# Patient Record
Sex: Female | Born: 1935 | Race: Black or African American | Hispanic: No | Smoking: Never smoker
Health system: Southern US, Community
[De-identification: ages and names within clinical notes are randomized; demographics above are authoritative.]

## PROBLEM LIST (undated history)

## (undated) DIAGNOSIS — F32A Depression, unspecified: Secondary | ICD-10-CM

## (undated) DIAGNOSIS — N289 Disorder of kidney and ureter, unspecified: Secondary | ICD-10-CM

## (undated) DIAGNOSIS — I4891 Unspecified atrial fibrillation: Secondary | ICD-10-CM

## (undated) DIAGNOSIS — D649 Anemia, unspecified: Secondary | ICD-10-CM

## (undated) DIAGNOSIS — F329 Major depressive disorder, single episode, unspecified: Secondary | ICD-10-CM

## (undated) DIAGNOSIS — F0390 Unspecified dementia without behavioral disturbance: Secondary | ICD-10-CM

## (undated) DIAGNOSIS — I10 Essential (primary) hypertension: Secondary | ICD-10-CM

---

## 2001-07-07 ENCOUNTER — Ambulatory Visit (HOSPITAL_COMMUNITY): Admission: RE | Admit: 2001-07-07 | Discharge: 2001-07-07 | Payer: Self-pay | Admitting: Family Medicine

## 2009-08-15 ENCOUNTER — Emergency Department (HOSPITAL_COMMUNITY): Admission: EM | Admit: 2009-08-15 | Discharge: 2009-08-15 | Payer: Self-pay | Admitting: Emergency Medicine

## 2011-02-14 ENCOUNTER — Ambulatory Visit (INDEPENDENT_AMBULATORY_CARE_PROVIDER_SITE_OTHER): Payer: Medicare Other

## 2011-02-14 DIAGNOSIS — L03119 Cellulitis of unspecified part of limb: Secondary | ICD-10-CM

## 2011-02-14 DIAGNOSIS — L02419 Cutaneous abscess of limb, unspecified: Secondary | ICD-10-CM

## 2011-02-14 DIAGNOSIS — M79609 Pain in unspecified limb: Secondary | ICD-10-CM

## 2011-02-14 DIAGNOSIS — Z23 Encounter for immunization: Secondary | ICD-10-CM

## 2011-04-11 ENCOUNTER — Ambulatory Visit: Payer: Medicare Other

## 2011-04-11 ENCOUNTER — Ambulatory Visit (INDEPENDENT_AMBULATORY_CARE_PROVIDER_SITE_OTHER): Payer: Medicare Other | Admitting: Family Medicine

## 2011-04-11 VITALS — BP 157/66 | HR 73 | Temp 98.1°F | Resp 16 | Ht 59.5 in | Wt 130.0 lb

## 2011-04-11 DIAGNOSIS — R3589 Other polyuria: Secondary | ICD-10-CM

## 2011-04-11 DIAGNOSIS — I499 Cardiac arrhythmia, unspecified: Secondary | ICD-10-CM

## 2011-04-11 DIAGNOSIS — R358 Other polyuria: Secondary | ICD-10-CM

## 2011-04-11 DIAGNOSIS — I4891 Unspecified atrial fibrillation: Secondary | ICD-10-CM | POA: Insufficient documentation

## 2011-04-11 DIAGNOSIS — R609 Edema, unspecified: Secondary | ICD-10-CM

## 2011-04-11 DIAGNOSIS — D649 Anemia, unspecified: Secondary | ICD-10-CM | POA: Insufficient documentation

## 2011-04-11 LAB — POCT URINALYSIS DIPSTICK
Bilirubin, UA: NEGATIVE
Blood, UA: NEGATIVE
Glucose, UA: NEGATIVE
Ketones, UA: NEGATIVE
Nitrite, UA: POSITIVE
Protein, UA: NEGATIVE
Spec Grav, UA: 1.01
Urobilinogen, UA: 0.2
pH, UA: 6.5

## 2011-04-11 LAB — POCT CBC
Granulocyte percent: 49.8 %G (ref 37–80)
HCT, POC: 30.9 % — AB (ref 37.7–47.9)
Hemoglobin: 9.8 g/dL — AB (ref 12.2–16.2)
Lymph, poc: 2.3 (ref 0.6–3.4)
MCH, POC: 30.4 pg (ref 27–31.2)
MCHC: 31.7 g/dL — AB (ref 31.8–35.4)
MCV: 35.9 fL — AB (ref 80–97)
MID (cbc): 0.3 (ref 0–0.9)
MPV: 8 fL (ref 0–99.8)
POC Granulocyte: 2.6 (ref 2–6.9)
POC LYMPH PERCENT: 45.1 %L (ref 10–50)
POC MID %: 5.1 %M (ref 0–12)
Platelet Count, POC: 224 10*3/uL (ref 142–424)
RBC: 3.22 M/uL — AB (ref 4.04–5.48)
RDW, POC: 13.1 %
WBC: 5.2 10*3/uL (ref 4.6–10.2)

## 2011-04-11 LAB — POCT UA - MICROSCOPIC ONLY
Casts, Ur, LPF, POC: NEGATIVE
Crystals, Ur, HPF, POC: NEGATIVE
Epithelial cells, urine per micros: NEGATIVE
Mucus, UA: NEGATIVE
RBC, urine, microscopic: NEGATIVE
Yeast, UA: NEGATIVE

## 2011-04-11 LAB — GLUCOSE, POCT (MANUAL RESULT ENTRY): POC Glucose: 83

## 2011-04-11 MED ORDER — CYANOCOBALAMIN 1000 MCG/ML IJ SOLN
1000.0000 ug | Freq: Once | INTRAMUSCULAR | Status: AC
  Administered 2011-04-11: 1000 ug via INTRAMUSCULAR

## 2011-04-11 MED ORDER — FUROSEMIDE 20 MG PO TABS: 20.0000 mg | ORAL_TABLET | Freq: Every day | ORAL | Status: DC

## 2011-04-11 MED ORDER — NITROFURANTOIN MONOHYD MACRO 100 MG PO CAPS: 100.0000 mg | ORAL_CAPSULE | Freq: Two times a day (BID) | ORAL | Status: AC

## 2011-04-11 NOTE — Progress Notes (Signed)
78 year old woman with memory problems who presents with a 3 to four-day history of progressive edema in the lower extremities. Denies chest pain shortness of breath, but notes some polyuria. She is brought in by her family members.  Objective: Elderly African American woman in no acute distress.  HEENT: Unremarkable without JVD, dyspnea,  Chest: Clear to auscultation  Heart: Holosystolic best heard at the left heart border 3/6, no gallop  Abdomen: Soft nontender, mildly obese  Extremities: 3+ edema with mild erythema halfway to the knees and diffuse tenderness over the anterior shins  EKG: Atrial fibrillation  [facility] reading (PRIMARY) by  Dr. Milus Glazier NAD. Results for orders placed in visit on 04/11/11  POCT CBC      Component Value Range   WBC 5.2  4.6 - 10.2 (K/uL)   Lymph, poc 2.3  0.6 - 3.4    POC LYMPH PERCENT 45.1  10 - 50 (%L)   MID (cbc) 0.3  0 - 0.9    POC MID % 5.1  0 - 12 (%M)   POC Granulocyte 2.6  2 - 6.9    Granulocyte percent 49.8  37 - 80 (%G)   RBC 3.22 (*) 4.04 - 5.48 (M/uL)   Hemoglobin 9.8 (*) 12.2 - 16.2 (g/dL)   HCT, POC 54.0 (*) 98.1 - 47.9 (%)   MCV 35.9 (*) 80 - 97 (fL)   MCH, POC 30.4  27 - 31.2 (pg)   MCHC 31.7 (*) 31.8 - 35.4 (g/dL)   RDW, POC 19.1     Platelet Count, POC 224  142 - 424 (K/uL)   MPV 8.0  0 - 99.8 (fL)  POCT URINALYSIS DIPSTICK      Component Value Range   Color, UA yellow     Clarity, UA clear     Glucose, UA negative     Bilirubin, UA negative     Ketones, UA negative     Spec Grav, UA 1.010     Blood, UA negative     pH, UA 6.5     Protein, UA negative     Urobilinogen, UA 0.2     Nitrite, UA positive     Leukocytes, UA small (1+)    GLUCOSE, POCT (MANUAL RESULT ENTRY)      Component Value Range   POC Glucose 83     I spoke with Dr. Jacinto Halim about the new a.fib.Marland Kitchenshe will be called by his office on Monday  A:  Anemia, UTI, new onset a.fib  P:  b12 shot, follow up 1 month for anemia Macrobid 100 bid x 7 days Dr.  Jacinto Halim review on Monday

## 2011-04-12 LAB — COMPREHENSIVE METABOLIC PANEL
ALT: 11 U/L (ref 0–35)
AST: 24 U/L (ref 0–37)
Albumin: 4.1 g/dL (ref 3.5–5.2)
Alkaline Phosphatase: 65 U/L (ref 39–117)
BUN: 19 mg/dL (ref 6–23)
CO2: 25 mEq/L (ref 19–32)
Calcium: 9.7 mg/dL (ref 8.4–10.5)
Chloride: 108 mEq/L (ref 96–112)
Creat: 1.18 mg/dL — ABNORMAL HIGH (ref 0.50–1.10)
Glucose, Bld: 77 mg/dL (ref 70–99)
Potassium: 4.9 mEq/L (ref 3.5–5.3)
Sodium: 139 mEq/L (ref 135–145)
Total Bilirubin: 0.5 mg/dL (ref 0.3–1.2)
Total Protein: 7.4 g/dL (ref 6.0–8.3)

## 2011-04-13 LAB — URINE CULTURE: Colony Count: >=100000

## 2011-06-11 ENCOUNTER — Telehealth: Payer: Self-pay | Admitting: Family Medicine

## 2011-06-11 NOTE — Telephone Encounter (Signed)
Message copied by Elvina Sidle on Wed Jun 11, 2011 12:47 PM ------      Message from: Thelma Barge D      Created: Tue Jun 10, 2011  8:59 PM       CHART IS IN YOUR BOX FOR REVIEW.  BRAD MCADAMS FROM [facility] CAME IN AND NEEDED A FORM FOR REFERRAL TO THEM.  HE STATED THAT PT IS UNABLE TO CARE FOR SELF.

## 2011-06-11 NOTE — Telephone Encounter (Signed)
I have not seen this patient in 4 months.  It looks like someone wants me to sign a hospice form.  I do not have information to substantiate a referral to hospice.

## 2011-06-12 NOTE — Telephone Encounter (Signed)
Form was signed by Dr L and faxed back to [facility]. LMOM for DSS caseworker, Salome Spotted, that was done

## 2011-06-18 ENCOUNTER — Other Ambulatory Visit: Payer: Self-pay | Admitting: Family Medicine

## 2011-06-24 ENCOUNTER — Encounter: Payer: Self-pay | Admitting: Internal Medicine

## 2011-06-24 ENCOUNTER — Ambulatory Visit (INDEPENDENT_AMBULATORY_CARE_PROVIDER_SITE_OTHER): Payer: Medicare Other | Admitting: Internal Medicine

## 2011-06-24 VITALS — BP 159/62 | HR 62 | Temp 98.0°F | Resp 16 | Ht <= 58 in | Wt 125.0 lb

## 2011-06-24 DIAGNOSIS — N289 Disorder of kidney and ureter, unspecified: Secondary | ICD-10-CM

## 2011-06-24 DIAGNOSIS — E785 Hyperlipidemia, unspecified: Secondary | ICD-10-CM

## 2011-06-24 DIAGNOSIS — R011 Cardiac murmur, unspecified: Secondary | ICD-10-CM

## 2011-06-24 DIAGNOSIS — Z9114 Patient's other noncompliance with medication regimen: Secondary | ICD-10-CM

## 2011-06-24 DIAGNOSIS — I350 Nonrheumatic aortic (valve) stenosis: Secondary | ICD-10-CM | POA: Insufficient documentation

## 2011-06-24 DIAGNOSIS — R609 Edema, unspecified: Secondary | ICD-10-CM

## 2011-06-24 DIAGNOSIS — N189 Chronic kidney disease, unspecified: Secondary | ICD-10-CM

## 2011-06-24 LAB — POCT CBC
Granulocyte percent: 50.9 %G (ref 37–80)
Hemoglobin: 10.4 g/dL — AB (ref 12.2–16.2)
MCH, POC: 30.7 pg (ref 27–31.2)
MCV: 95.4 fL (ref 80–97)
MPV: 8.2 fL (ref 0–99.8)
POC MID %: 6.6 %M (ref 0–12)
RBC: 3.39 M/uL — AB (ref 4.04–5.48)
WBC: 7 10*3/uL (ref 4.6–10.2)

## 2011-06-24 LAB — POCT URINALYSIS DIPSTICK
Glucose, UA: NEGATIVE
Ketones, UA: NEGATIVE
Spec Grav, UA: 1.015

## 2011-06-24 LAB — COMPREHENSIVE METABOLIC PANEL
ALT: 11 U/L (ref 0–35)
AST: 23 U/L (ref 0–37)
CO2: 27 mEq/L (ref 19–32)
Calcium: 10.2 mg/dL (ref 8.4–10.5)
Chloride: 107 mEq/L (ref 96–112)
Sodium: 141 mEq/L (ref 135–145)
Total Protein: 7.7 g/dL (ref 6.0–8.3)

## 2011-06-24 LAB — POCT UA - MICROSCOPIC ONLY

## 2011-06-24 NOTE — Progress Notes (Signed)
Subjective:    Patient ID: Crystal Rowland, female    DOB: 07-03-1914, 78 y.o.   MRN: [id number]  HPI C/o bilateral leg and foot swelling right leg swelling is worse than the left onset 3 days ago, increased in the am, Moderate in severity, no leg pain, no radiation of swelling, No chest pain, no shortness of breath no fatigue Patient it not using a pill dispenser for the week, often forgets to take meds as per niece and ran out of lasix for one week restarted it todaypt is also requesting a vit b shot, says she has gotten them in the past  Review of Systems  Constitutional: Negative.   HENT: Negative.   Eyes: Negative.   Respiratory: Negative.   Cardiovascular: Negative.   Gastrointestinal: Negative.   Genitourinary: Negative.   Musculoskeletal: Negative.        Extremitiy swelling  Skin: Negative.   Neurological: Negative.   Hematological: Negative.   Psychiatric/Behavioral: Negative.   All other systems reviewed and are negative.       Objective:   Physical Exam  Nursing note and vitals reviewed. Constitutional: She is oriented to person, place, and time. She appears well-developed and well-nourished.  HENT:  Head: Normocephalic and atraumatic.  Right Ear: External ear normal.  Left Ear: External ear normal.  Eyes: Conjunctivae and EOM are normal. Pupils are equal, round, and reactive to light.  Neck: Normal range of motion. Neck supple.  Cardiovascular: Normal rate and regular rhythm.   Murmur heard.      3/6 systolic ejection murmur;rate and rhythm are regular  Pulmonary/Chest: Effort normal and breath sounds normal.       Lungs are clear no rales  Abdominal: Soft. Bowel sounds are normal.  Musculoskeletal: She exhibits edema.       Right leg edema is greater than the left  Neurological: She is alert and oriented to person, place, and time. She has normal reflexes.  Skin: Skin is warm and dry.  Psychiatric: She has a normal mood and affect. Her behavior is  normal. Judgment and thought content normal.      Results for orders placed in visit on 06/24/11  POCT CBC      Component Value Range   WBC 7.0  4.6 - 10.2 (K/uL)   Lymph, poc 3.0  0.6 - 3.4    POC LYMPH PERCENT 42.5  10 - 50 (%L)   MID (cbc) 0.5  0 - 0.9    POC MID % 6.6  0 - 12 (%M)   POC Granulocyte 3.6  2 - 6.9    Granulocyte percent 50.9  37 - 80 (%G)   RBC 3.39 (*) 4.04 - 5.48 (M/uL)   Hemoglobin 10.4 (*) 12.2 - 16.2 (g/dL)   HCT, POC 40.9 (*) 81.1 - 47.9 (%)   MCV 95.4  80 - 97 (fL)   MCH, POC 30.7  27 - 31.2 (pg)   MCHC 32.2  31.8 - 35.4 (g/dL)   RDW, POC 91.4     Platelet Count, POC 250  142 - 424 (K/uL)   MPV 8.2  0 - 99.8 (fL)  POCT UA - MICROSCOPIC ONLY      Component Value Range   WBC, Ur, HPF, POC TNTC     RBC, urine, microscopic 0-1     Bacteria, U Microscopic 4+     Mucus, UA negative     Epithelial cells, urine per micros 0-2     Crystals, Ur, HPF, POC  negative     Casts, Ur, LPF, POC negative     Yeast, UA negative    POCT URINALYSIS DIPSTICK      Component Value Range   Color, UA yellow     Clarity, UA cloudy     Glucose, UA negative     Bilirubin, UA negative     Ketones, UA negative     Spec Grav, UA 1.015     Blood, UA negative     pH, UA 6.0     Protein, UA negative     Urobilinogen, UA 1.0     Nitrite, UA negative     Leukocytes, UA small (1+)       anemia  uti Assessment & Plan:  78 year old woman noncompliant with meds, niece brought her today nad has refilled rx for her for lasix Hx of anemia, renal insuffiencey, hyperlipidemia, chf, htn. Will eval ekg labs encourage compliance with meds and follow up primary care with [facility] Anemia stable h/h with normal indices dlikely chronic disease.  uti will rx with abs  uti

## 2011-06-24 NOTE — Patient Instructions (Signed)
Use a weekly pill dispense. Take meds as directed. Bactrim ds 1 tab 2 times daily f or 3 days. F/up with [facility] and your cardiologist.

## 2011-07-10 ENCOUNTER — Telehealth: Payer: Self-pay

## 2011-07-10 NOTE — Telephone Encounter (Signed)
Faxed over records per request.

## 2011-07-10 NOTE — Telephone Encounter (Signed)
Brad Mcadams from Hospice is requesting pt recent labs, he states he hs  auth and he would send his nurse over with info, per [employer] over.  fax to [phone]

## 2011-08-18 ENCOUNTER — Other Ambulatory Visit: Payer: Self-pay | Admitting: Family Medicine

## 2011-08-22 ENCOUNTER — Telehealth: Payer: Self-pay

## 2011-11-17 ENCOUNTER — Encounter: Payer: Self-pay | Admitting: *Deleted

## 2011-11-17 DIAGNOSIS — I350 Nonrheumatic aortic (valve) stenosis: Secondary | ICD-10-CM

## 2012-10-27 ENCOUNTER — Emergency Department (HOSPITAL_COMMUNITY)
Admission: EM | Admit: 2012-10-27 | Discharge: 2012-10-27 | Disposition: A | Discharge status: Home / Self Care | Payer: Medicare Other | Attending: Emergency Medicine | Admitting: Emergency Medicine

## 2012-10-27 DIAGNOSIS — IMO0001 Reserved for inherently not codable concepts without codable children: Secondary | ICD-10-CM | POA: Insufficient documentation

## 2012-10-27 DIAGNOSIS — R252 Cramp and spasm: Secondary | ICD-10-CM

## 2012-10-27 DIAGNOSIS — M79609 Pain in unspecified limb: Secondary | ICD-10-CM | POA: Insufficient documentation

## 2012-10-27 LAB — URINE MICROSCOPIC-ADD ON

## 2012-10-27 LAB — POCT I-STAT, CHEM 8
BUN: 18 mg/dL (ref 6–23)
Creatinine, Ser: 1.4 mg/dL — ABNORMAL HIGH (ref 0.50–1.10)
Hemoglobin: 10.5 g/dL — ABNORMAL LOW (ref 12.0–15.0)
Potassium: 4.2 mEq/L (ref 3.5–5.1)
Sodium: 142 mEq/L (ref 135–145)
TCO2: 23 mmol/L (ref 0–100)

## 2012-10-27 LAB — URINALYSIS, ROUTINE W REFLEX MICROSCOPIC
Glucose, UA: NEGATIVE mg/dL
Hgb urine dipstick: NEGATIVE
Protein, ur: NEGATIVE mg/dL
Specific Gravity, Urine: 1.015 (ref 1.005–1.030)
Urobilinogen, UA: 0.2 mg/dL (ref 0.0–1.0)

## 2012-10-27 MED ORDER — CEPHALEXIN 500 MG PO CAPS: 500.0000 mg | ORAL_CAPSULE | Freq: Three times a day (TID) | ORAL | Status: DC

## 2012-10-27 NOTE — Progress Notes (Signed)
Pt confirms pcp is Dr Adrian Saran bland and Pt states Dr Delrae Rend this is the last dr she saw on August 06 2010 EPIC updated

## 2012-10-27 NOTE — ED Provider Notes (Signed)
Medical screening examination/treatment/procedure(s) were conducted as a shared visit with non-physician practitioner(s) and myself.  I personally evaluated the patient during the encounter  Presented with cramping of both legs. Exam revealed no skin changes, no swelling, no calf tenderness or cords. W/U negative.  Gilda Crease, MD 10/27/12 [phone]

## 2012-10-27 NOTE — ED Provider Notes (Signed)
CSN: [id number]     Arrival date & time 10/27/12  1343 History   First MD Initiated Contact with Patient 10/27/12 1505     Chief Complaint  Patient presents with  . Leg cramps    (Consider location/radiation/quality/duration/timing/severity/associated sxs/prior Treatment) The history is provided by the patient, medical records and a relative.   Pt presents to the ED for bilateral LE cramping, onset this morning upon waking.  Pt states cramping was short lived, lasting approx 15 minutes before resolving completely without recurrence.  No associated leg swelling or difficulty walking. No recent falls, injury, or traumas to the legs.  No hx of DVT.  Family also states that pt has been off of all her HTN meds for an unknown period of time, likely several months, and they would like them refilled.  No chest pain, SOB, abdominal pain, N/V/D.  No past medical history on file. No past surgical history on file. No family history on file. History  Substance Use Topics  . Smoking status: Never Smoker   . Smokeless tobacco: Not on file  . Alcohol Use: Not on file   OB History   Grav Para Term Preterm Abortions TAB SAB Ect Mult Living                 Review of Systems  Musculoskeletal: Positive for myalgias.  All other systems reviewed and are negative.    Allergies  Review of patient's allergies indicates no known allergies.  Home Medications  No current outpatient prescriptions on file. BP 169/54  Pulse 70  Temp(Src) 98.3 F (36.8 C) (Oral)  Resp 16  SpO2 98%  Physical Exam  Nursing note and vitals reviewed. Constitutional: She is oriented to person, place, and time. She appears well-developed and well-nourished. No distress.  HENT:  Head: Normocephalic and atraumatic.  Mouth/Throat: Oropharynx is clear and moist.  Eyes: Conjunctivae and EOM are normal.  Neck: Normal range of motion. Neck supple.  Cardiovascular: Normal rate, regular rhythm and normal heart sounds.    Pulmonary/Chest: Effort normal and breath sounds normal. No respiratory distress. She has no wheezes.  Musculoskeletal: Normal range of motion. She exhibits no edema.  No calf pain, asymmetry, or palpable cord; overlying skin normal in appearance; strong distal pulse and cap refill, sensation intact  Neurological: She is alert and oriented to person, place, and time.  Skin: Skin is warm and dry. She is not diaphoretic.  Psychiatric: She has a normal mood and affect.    ED Course  Procedures (including critical care time) Labs Review Labs Reviewed  URINALYSIS, ROUTINE W REFLEX MICROSCOPIC - Abnormal; Notable for the following:    APPearance CLOUDY (*)    Nitrite POSITIVE (*)    Leukocytes, UA MODERATE (*)    All other components within normal limits  URINE MICROSCOPIC-ADD ON - Abnormal; Notable for the following:    Bacteria, UA MANY (*)    All other components within normal limits  POCT I-STAT, CHEM 8 - Abnormal; Notable for the following:    Creatinine, Ser 1.40 (*)    Hemoglobin 10.5 (*)    HCT 31.0 (*)    All other components within normal limits  URINE CULTURE   Imaging Review No results found.  MDM   1. Leg cramps    Electrolytes WNL, SrCr appears at baseline when compared with previous.  I doubt DVT at this time.  At time of d/c family mentions that pt has been urinating frequently-- u/a nitrite +, culture pending.  Rx keflex.  Pt will FU with her PCP for re-check and medication management.  Discussed plan with pt and family, they agreed.  Return precautions advised.  Discussed pt with Dr. Blinda Leatherwood who personally evaluated pt and agrees with plan.  Garlon Hatchet, PA-C 10/27/12 2110

## 2012-10-27 NOTE — ED Notes (Signed)
Per PA, waiting for UA results before discharging pt.

## 2012-10-27 NOTE — ED Notes (Signed)
Pt c/o bilateral leg cramps at home and states "now I feel better I don't feel any cramping or nothin."

## 2012-10-27 NOTE — ED Notes (Signed)
Pt family reports pt has been having frequent urination and requested UA be done.

## 2012-10-29 LAB — URINE CULTURE: Colony Count: >=100000

## 2012-10-30 ENCOUNTER — Telehealth (HOSPITAL_COMMUNITY): Payer: Self-pay | Admitting: Emergency Medicine

## 2012-10-30 NOTE — ED Notes (Signed)
Post ED Visit - Positive Culture Follow-up  Culture report reviewed by antimicrobial stewardship pharmacist: []  Wes Dulaney, Pharm.D., BCPS []  Celedonio Miyamoto, Pharm.D., BCPS []  Georgina Pillion, Pharm.D., BCPS []  [place], [address].D., BCPS, AAHIVP []  Estella Husk, Pharm.D., BCPS, AAHIVP [x]  Lora Seay, Pharm.D., BCPS  Positive urine culture Treated with Keflex, organism sensitive to the same and no further patient follow-up is required at this time.  Kylie A Holland 10/30/2012, 4:56 PM

## 2013-12-15 ENCOUNTER — Emergency Department (HOSPITAL_BASED_OUTPATIENT_CLINIC_OR_DEPARTMENT_OTHER)
Admission: EM | Admit: 2013-12-15 | Discharge: 2013-12-15 | Disposition: A | Discharge status: Home / Self Care | Payer: Medicare Other | Attending: Emergency Medicine | Admitting: Emergency Medicine

## 2013-12-15 ENCOUNTER — Emergency Department (HOSPITAL_BASED_OUTPATIENT_CLINIC_OR_DEPARTMENT_OTHER): Payer: Medicare Other

## 2013-12-15 ENCOUNTER — Encounter (HOSPITAL_BASED_OUTPATIENT_CLINIC_OR_DEPARTMENT_OTHER): Payer: Self-pay | Admitting: Emergency Medicine

## 2013-12-15 DIAGNOSIS — Z792 Long term (current) use of antibiotics: Secondary | ICD-10-CM | POA: Diagnosis not present

## 2013-12-15 DIAGNOSIS — Y939 Activity, unspecified: Secondary | ICD-10-CM | POA: Diagnosis not present

## 2013-12-15 DIAGNOSIS — W01198A Fall on same level from slipping, tripping and stumbling with subsequent striking against other object, initial encounter: Secondary | ICD-10-CM | POA: Diagnosis not present

## 2013-12-15 DIAGNOSIS — S0990XA Unspecified injury of head, initial encounter: Secondary | ICD-10-CM | POA: Insufficient documentation

## 2013-12-15 DIAGNOSIS — W19XXXA Unspecified fall, initial encounter: Secondary | ICD-10-CM

## 2013-12-15 DIAGNOSIS — Y92009 Unspecified place in unspecified non-institutional (private) residence as the place of occurrence of the external cause: Secondary | ICD-10-CM | POA: Insufficient documentation

## 2013-12-15 NOTE — Discharge Instructions (Signed)

## 2013-12-15 NOTE — ED Notes (Signed)
Pt "stumbled" at home and fell onto cement, hitting left side of foreahead.  No LOC. No blood thinners.  Pt lives in private home by herself.  Presents with neighbor who helps her.

## 2013-12-15 NOTE — ED Provider Notes (Signed)
CSN: [id number]     Arrival date & time 12/15/13  1726 History   First MD Initiated Contact with Patient 12/15/13 1742     Chief Complaint  Patient presents with  . Fall      HPI Patient had an accidental mechanical fall at home onto the cement.  Struck her head but no loss of consciousness.  Is on no blood thinners the present time.  Denies headache or vomiting.  Has some tenderness of the left hip.  Brought in by a neighbor.  Otherwise no complaints. History reviewed. No pertinent past medical history. History reviewed. No pertinent past surgical history. No family history on file. History  Substance Use Topics  . Smoking status: Never Smoker   . Smokeless tobacco: Not on file  . Alcohol Use: No   OB History   Grav Para Term Preterm Abortions TAB SAB Ect Mult Living                 Review of Systems  All other systems reviewed and are negative  Allergies  Review of patient's allergies indicates no known allergies.  Home Medications   Prior to Admission medications   Medication Sig Start Date End Date Taking? Authorizing Provider  cephALEXin (KEFLEX) 500 MG capsule Take 1 capsule (500 mg total) by mouth 3 (three) times daily. 10/27/12   Garlon HatchetLisa M Sanders, PA-C   BP 151/59  Pulse 76  Temp(Src) 98.6 F (37 C) (Oral)  Resp 16  Ht 4\' 11"  (1.499 m)  Wt 128 lb (58.06 kg)  BMI 25.84 kg/m2  SpO2 100% Physical Exam Physical Exam  Nursing note and vitals reviewed. Constitutional: She is oriented to person, place, and time. She appears well-developed and well-nourished. No distress.  HENT:  Head: Normocephalic contusion hematoma noted to left forehead.  Eyes: Pupils are equal, round, and reactive to light.  Neck: Normal range of motion.  Cardiovascular: Normal rate and intact distal pulses.   Pulmonary/Chest: No respiratory distress.  Abdominal: Normal appearance. She exhibits no distension.  Musculoskeletal: Normal range of motion.  some tenderness to the left hip to  palpation and movement.  Very mild. Neurological: She is alert and oriented to person, place, and time. No cranial nerve deficit.  Skin: Skin is warm and dry. No rash noted.  Psychiatric: She has a normal mood and affect. Her behavior is normal.   ED Course  Procedures (including critical care time) Labs Review Labs Reviewed - No data to display  Imaging Review Dg Hip Complete Left  12/15/2013   CLINICAL DATA:  Fall, left hip pain  EXAM: LEFT HIP - COMPLETE 2+ VIEW  COMPARISON:  None.  FINDINGS: No fracture or dislocation is seen.  Degenerative changes of the bilateral hips.  Visualized bony pelvis appears intact.  Degenerative changes of the lumbar spine.  Calcified uterine fibroid.  IMPRESSION: No fracture or dislocation is seen.   Electronically Signed   By: Charline BillsSriyesh  Krishnan M.D.   On: 12/15/2013 18:51   Ct Head Wo Contrast  12/15/2013   CLINICAL DATA:  Fall, hit left forehead  EXAM: CT HEAD WITHOUT CONTRAST  TECHNIQUE: Contiguous axial images were obtained from the base of the skull through the vertex without intravenous contrast.  COMPARISON:  None.  FINDINGS: No evidence of parenchymal hemorrhage or extra-axial fluid collection. No mass lesion, mass effect, or midline shift.  No CT evidence of acute infarction.  Subcortical white matter and periventricular small vessel ischemic changes. Intracranial atherosclerosis.  Age related atrophy.  No ventriculomegaly.  The visualized paranasal sinuses are essentially clear. The mastoid air cells are unopacified.  No evidence of calvarial fracture.  IMPRESSION: No evidence of acute intracranial abnormality.  Atrophy with small vessel ischemic changes and intracranial atherosclerosis.   Electronically Signed   By: Charline BillsSriyesh  Krishnan M.D.   On: 12/15/2013 18:54      MDM   Final diagnoses:  Fall, initial encounter        Nelia Shiobert L Neriyah Cercone, MD 12/15/13 Ernestina [id number]

## 2014-01-09 ENCOUNTER — Emergency Department (HOSPITAL_BASED_OUTPATIENT_CLINIC_OR_DEPARTMENT_OTHER)
Admission: EM | Admit: 2014-01-09 | Discharge: 2014-01-09 | Disposition: A | Discharge status: Home / Self Care | Payer: Medicare Other | Attending: Emergency Medicine | Admitting: Emergency Medicine

## 2014-01-09 ENCOUNTER — Emergency Department (HOSPITAL_BASED_OUTPATIENT_CLINIC_OR_DEPARTMENT_OTHER): Payer: Medicare Other

## 2014-01-09 ENCOUNTER — Encounter (HOSPITAL_BASED_OUTPATIENT_CLINIC_OR_DEPARTMENT_OTHER): Payer: Self-pay | Admitting: *Deleted

## 2014-01-09 DIAGNOSIS — Y998 Other external cause status: Secondary | ICD-10-CM | POA: Diagnosis not present

## 2014-01-09 DIAGNOSIS — Z79899 Other long term (current) drug therapy: Secondary | ICD-10-CM | POA: Insufficient documentation

## 2014-01-09 DIAGNOSIS — S0990XA Unspecified injury of head, initial encounter: Secondary | ICD-10-CM | POA: Diagnosis not present

## 2014-01-09 DIAGNOSIS — I10 Essential (primary) hypertension: Secondary | ICD-10-CM | POA: Insufficient documentation

## 2014-01-09 DIAGNOSIS — S79912A Unspecified injury of left hip, initial encounter: Secondary | ICD-10-CM | POA: Insufficient documentation

## 2014-01-09 DIAGNOSIS — R52 Pain, unspecified: Secondary | ICD-10-CM

## 2014-01-09 DIAGNOSIS — Y92009 Unspecified place in unspecified non-institutional (private) residence as the place of occurrence of the external cause: Secondary | ICD-10-CM | POA: Insufficient documentation

## 2014-01-09 DIAGNOSIS — Z792 Long term (current) use of antibiotics: Secondary | ICD-10-CM | POA: Insufficient documentation

## 2014-01-09 DIAGNOSIS — Y9389 Activity, other specified: Secondary | ICD-10-CM | POA: Insufficient documentation

## 2014-01-09 DIAGNOSIS — S199XXA Unspecified injury of neck, initial encounter: Secondary | ICD-10-CM | POA: Diagnosis present

## 2014-01-09 DIAGNOSIS — W1830XA Fall on same level, unspecified, initial encounter: Secondary | ICD-10-CM | POA: Diagnosis not present

## 2014-01-09 DIAGNOSIS — S161XXA Strain of muscle, fascia and tendon at neck level, initial encounter: Secondary | ICD-10-CM | POA: Diagnosis not present

## 2014-01-09 HISTORY — DX: Essential (primary) hypertension: I10

## 2014-01-09 MED ORDER — ACETAMINOPHEN 325 MG PO TABS
650.0000 mg | ORAL_TABLET | Freq: Once | ORAL | Status: AC
  Administered 2014-01-09: 650 mg via ORAL
  Filled 2014-01-09: qty 2

## 2014-01-09 NOTE — ED Provider Notes (Signed)
CSN: [id number]     Arrival date & time 01/09/14  1043 History   First MD Initiated Contact with Patient 01/09/14 1101     Chief Complaint  Patient presents with  . Neck Pain     (Consider location/radiation/quality/duration/timing/severity/associated sxs/prior Treatment) HPI Patient stumbled and fell at her home on 12/15/2013. She complains of neck pain left-sided paracervical since the event pain is nonradiating. She struck her head and complained of left hip pain as result fall. She reports that headache and hip pain have improved since the event. Neck pain improved temporarily but is worse over the past few days. Pain is worse with rotating her neck from side to side improved with remaining still. Pain is nonradiating. No treatment prior to coming here she had CT scan of her head and x-ray of left hip performed at 12/15/2013 which were both negative for acute injury. Past Medical History  Diagnosis Date  . Hypertension    History reviewed. No pertinent past surgical history. No family history on file. History  Substance Use Topics  . Smoking status: Never Smoker   . Smokeless tobacco: Not on file  . Alcohol Use: No   OB History    No data available     Review of Systems  Constitutional: Negative.   HENT: Negative.   Respiratory: Negative.   Cardiovascular: Negative.   Gastrointestinal: Negative.   Musculoskeletal: Positive for gait problem and neck pain.       Ox with cane  Skin: Negative.   Neurological: Negative.   Psychiatric/Behavioral: Negative.   All other systems reviewed and are negative.     Allergies  Review of patient's allergies indicates no known allergies.  Home Medications   Prior to Admission medications   Medication Sig Start Date End Date Taking? Authorizing Provider  AmLODIPine Besylate (NORVASC PO) Take by mouth.   Yes Historical Provider, MD  cephALEXin (KEFLEX) 500 MG capsule Take 1 capsule (500 mg total) by mouth 3 (three) times daily.  10/27/12   Garlon HatchetLisa M Sanders, PA-C   BP 159/55 mmHg  Pulse 61  Temp(Src) 97.8 F (36.6 C) (Oral)  Resp 20  Ht 4\' 11"  (1.499 m)  Wt 128 lb (58.06 kg)  BMI 25.84 kg/m2  SpO2 100% Physical Exam  Constitutional: She appears well-developed and well-nourished.  HENT:  Head: Normocephalic and atraumatic.  Eyes: Conjunctivae are normal. Pupils are equal, round, and reactive to light.  Neck: Neck supple. No tracheal deviation present. No thyromegaly present.  Cardiovascular: Normal rate and regular rhythm.   No murmur heard. Pulmonary/Chest: Effort normal and breath sounds normal.  Abdominal: Soft. Bowel sounds are normal. She exhibits no distension. There is no tenderness.  Musculoskeletal: Normal range of motion. She exhibits no edema or tenderness.  Cervical spineLeft-sided paracervical tenderness. Full active range of motionwith pain on rotation. Thoracic and lumbar spine nontender. Pelvis stable nontender. All 4 extremities without contusion abrasion or tenderness neurovascularly intact  Neurological: She is alert. No cranial nerve deficit. Coordination normal.  Walks with cane without difficulty  Skin: Skin is warm and dry. No rash noted.  Psychiatric: She has a normal mood and affect.  Nursing note and vitals reviewed.   ED Course  Procedures (including critical care time) Labs Review Labs Reviewed - No data to display  Imaging Review No results found.   EKG Interpretation None     12:30 PM pain improved after treatment with Tylenol Results for orders placed or performed during the hospital encounter of 10/27/12  Urine culture  Result Value Ref Range   Specimen Description URINE, CLEAN CATCH    Special Requests NONE    Culture  Setup Time      10/28/2012 01:13 Performed at [facility] Count      >=100,000 COLONIES/ML Performed at [facility]   Culture      ESCHERICHIA COLI Performed at [facility]   Report Status 10/29/2012 FINAL     Organism ID, Bacteria ESCHERICHIA COLI       Susceptibility   Escherichia coli - MIC*    AMPICILLIN 4 SENSITIVE Sensitive     CEFAZOLIN >=64 RESISTANT Resistant     CEFTRIAXONE <=1 SENSITIVE Sensitive     CIPROFLOXACIN <=0.25 SENSITIVE Sensitive     GENTAMICIN <=1 SENSITIVE Sensitive     LEVOFLOXACIN <=0.12 SENSITIVE Sensitive     NITROFURANTOIN <=16 SENSITIVE Sensitive     TOBRAMYCIN <=1 SENSITIVE Sensitive     TRIMETH/SULFA <=20 SENSITIVE Sensitive     PIP/TAZO <=4 SENSITIVE Sensitive     * ESCHERICHIA COLI  Urinalysis, Routine w reflex microscopic  Result Value Ref Range   Color, Urine YELLOW YELLOW   APPearance CLOUDY (A) CLEAR   Specific Gravity, Urine 1.015 1.005 - 1.030   pH 6.0 5.0 - 8.0   Glucose, UA NEGATIVE NEGATIVE mg/dL   Hgb urine dipstick NEGATIVE NEGATIVE   Bilirubin Urine NEGATIVE NEGATIVE   Ketones, ur NEGATIVE NEGATIVE mg/dL   Protein, ur NEGATIVE NEGATIVE mg/dL   Urobilinogen, UA 0.2 0.0 - 1.0 mg/dL   Nitrite POSITIVE (A) NEGATIVE   Leukocytes, UA MODERATE (A) NEGATIVE  Urine microscopic-add on  Result Value Ref Range   WBC, UA 7-10 <3 WBC/hpf   Bacteria, UA MANY (A) RARE  I-STAT, chem 8  Result Value Ref Range   Sodium 142 135 - 145 mEq/L   Potassium 4.2 3.5 - 5.1 mEq/L   Chloride 109 96 - 112 mEq/L   BUN 18 6 - 23 mg/dL   Creatinine, Ser 1.61 (H) 0.50 - 1.10 mg/dL   Glucose, Bld 92 70 - 99 mg/dL   Calcium, Ion 0.96 0.45 - 1.30 mmol/L   TCO2 23 0 - 100 mmol/L   Hemoglobin 10.5 (L) 12.0 - 15.0 g/dL   HCT 40.9 (L) 81.1 - 91.4 %   Dg Hip Complete Left  12/15/2013   CLINICAL DATA:  Fall, left hip pain  EXAM: LEFT HIP - COMPLETE 2+ VIEW  COMPARISON:  None.  FINDINGS: No fracture or dislocation is seen.  Degenerative changes of the bilateral hips.  Visualized bony pelvis appears intact.  Degenerative changes of the lumbar spine.  Calcified uterine fibroid.  IMPRESSION: No fracture or dislocation is seen.   Electronically Signed   By: Charline Bills  M.D.   On: 12/15/2013 18:51   Ct Head Wo Contrast  12/15/2013   CLINICAL DATA:  Fall, hit left forehead  EXAM: CT HEAD WITHOUT CONTRAST  TECHNIQUE: Contiguous axial images were obtained from the base of the skull through the vertex without intravenous contrast.  COMPARISON:  None.  FINDINGS: No evidence of parenchymal hemorrhage or extra-axial fluid collection. No mass lesion, mass effect, or midline shift.  No CT evidence of acute infarction.  Subcortical white matter and periventricular small vessel ischemic changes. Intracranial atherosclerosis.  Age related atrophy.  No ventriculomegaly.  The visualized paranasal sinuses are essentially clear. The mastoid air cells are unopacified.  No evidence of calvarial fracture.  IMPRESSION: No evidence of acute  intracranial abnormality.  Atrophy with small vessel ischemic changes and intracranial atherosclerosis.   Electronically Signed   By: Charline BillsSriyesh  Krishnan M.D.   On: 12/15/2013 18:54   Ct Cervical Spine Wo Contrast  01/09/2014   CLINICAL DATA:  Neck pain  EXAM: CT CERVICAL SPINE WITHOUT CONTRAST  TECHNIQUE: Multidetector CT imaging of the cervical spine was performed without intravenous contrast. Multiplanar CT image reconstructions were also generated.  COMPARISON:  None.  FINDINGS: The alignment is anatomic. The vertebral body heights are maintained. There are erosive changes of the dens with surrounding pannus formation. There are erosive changes involving the lateral masses of C1. There is no acute fracture. There is 1 mm of anterolisthesis of C3 on C4. The prevertebral soft tissues are normal. The intraspinal soft tissues are not fully imaged on this examination due to poor soft tissue contrast, but there is no gross soft tissue abnormality.  The areas degenerative disc disease at C3-4, C4-5, C6-7 and C7-T1. There is osseous fusion of the posterior elements of C5-6. There is facet arthropathy throughout the remainder of the cervical spine. There is  bilateral foraminal encroachment at C3-4 and C4-5.  The visualized portions of the lung apices demonstrate no focal abnormality.  IMPRESSION: 1. No acute osseous injury of the cervical spine. 2. Cervical spine spondylosis as described above. 3. Erosive changes of the dens with surrounding pannus formation and erosions involving the lateral masses of C1. These findings can be seen with rheumatoid arthritis.   Electronically Signed   By: Elige KoHetal  Patel   On: 01/09/2014 12:24    MDM  Than Tylenol for pain. Follow-up with primary care physician if significant pain in 2 or 3 weeks Final diagnoses:  None  diagnosis #1 fall #2 cervical strain      Doug SouSam Selma Mink, MD 01/09/14 1233

## 2014-01-09 NOTE — ED Notes (Signed)
Neck pain. She fell 3 weeks ago and has had pain since that time.

## 2014-01-09 NOTE — Discharge Instructions (Signed)
Cervical Sprain The CT scan of your spine shows arthritis.no serious injury. Take Tylenol for pain as directed. See your primary care doctor if significant pain within the next 2 or 3 weeks A cervical sprain is an injury in the neck in which the strong, fibrous tissues (ligaments) that connect your neck bones stretch or tear. Cervical sprains can range from mild to severe. Severe cervical sprains can cause the neck vertebrae to be unstable. This can lead to damage of the spinal cord and can result in serious nervous system problems. The amount of time it takes for a cervical sprain to get better depends on the cause and extent of the injury. Most cervical sprains heal in 1 to 3 weeks. CAUSES  Severe cervical sprains may be caused by:   Contact sport injuries (such as from football, rugby, wrestling, hockey, auto racing, gymnastics, diving, martial arts, or boxing).   Motor vehicle collisions.   Whiplash injuries. This is an injury from a sudden forward and backward whipping movement of the head and neck.  Falls.  Mild cervical sprains may be caused by:   Being in an awkward position, such as while cradling a telephone between your ear and shoulder.   Sitting in a chair that does not offer proper support.   Working at a poorly Marketing executivedesigned computer station.   Looking up or down for long periods of time.  SYMPTOMS   Pain, soreness, stiffness, or a burning sensation in the front, back, or sides of the neck. This discomfort may develop immediately after the injury or slowly, 24 hours or more after the injury.   Pain or tenderness directly in the middle of the back of the neck.   Shoulder or upper back pain.   Limited ability to move the neck.   Headache.   Dizziness.   Weakness, numbness, or tingling in the hands or arms.   Muscle spasms.   Difficulty swallowing or chewing.   Tenderness and swelling of the neck.  DIAGNOSIS  Most of the time your health care  provider can diagnose a cervical sprain by taking your history and doing a physical exam. Your health care provider will ask about previous neck injuries and any known neck problems, such as arthritis in the neck. X-rays may be taken to find out if there are any other problems, such as with the bones of the neck. Other tests, such as a CT scan or MRI, may also be needed.  TREATMENT  Treatment depends on the severity of the cervical sprain. Mild sprains can be treated with rest, keeping the neck in place (immobilization), and pain medicines. Severe cervical sprains are immediately immobilized. Further treatment is done to help with pain, muscle spasms, and other symptoms and may include:  Medicines, such as pain relievers, numbing medicines, or muscle relaxants.   Physical therapy. This may involve stretching exercises, strengthening exercises, and posture training. Exercises and improved posture can help stabilize the neck, strengthen muscles, and help stop symptoms from returning.  HOME CARE INSTRUCTIONS   Put ice on the injured area.   Put ice in a plastic bag.   Place a towel between your skin and the bag.   Leave the ice on for 15-20 minutes, 3-4 times a day.   If your injury was severe, you may have been given a cervical collar to wear. A cervical collar is a two-piece collar designed to keep your neck from moving while it heals.  Do not remove the collar unless instructed  by your health care provider.  If you have long hair, keep it outside of the collar.  Ask your health care provider before making any adjustments to your collar. Minor adjustments may be required over time to improve comfort and reduce pressure on your chin or on the back of your head.  Ifyou are allowed to remove the collar for cleaning or bathing, follow your health care provider's instructions on how to do so safely.  Keep your collar clean by wiping it with mild soap and water and drying it completely. If  the collar you have been given includes removable pads, remove them every 1-2 days and hand wash them with soap and water. Allow them to air dry. They should be completely dry before you wear them in the collar.  If you are allowed to remove the collar for cleaning and bathing, wash and dry the skin of your neck. Check your skin for irritation or sores. If you see any, tell your health care provider.  Do not drive while wearing the collar.   Only take over-the-counter or prescription medicines for pain, discomfort, or fever as directed by your health care provider.   Keep all follow-up appointments as directed by your health care provider.   Keep all physical therapy appointments as directed by your health care provider.   Make any needed adjustments to your workstation to promote good posture.   Avoid positions and activities that make your symptoms worse.   Warm up and stretch before being active to help prevent problems.  SEEK MEDICAL CARE IF:   Your pain is not controlled with medicine.   You are unable to decrease your pain medicine over time as planned.   Your activity level is not improving as expected.  SEEK IMMEDIATE MEDICAL CARE IF:   You develop any bleeding.  You develop stomach upset.  You have signs of an allergic reaction to your medicine.   Your symptoms get worse.   You develop new, unexplained symptoms.   You have numbness, tingling, weakness, or paralysis in any part of your body.  MAKE SURE YOU:   Understand these instructions.  Will watch your condition.  Will get help right away if you are not doing well or get worse. Document Released: 12/15/2006 Document Revised: 02/22/2013 Document Reviewed: 08/25/2012 [facility] Patient Information 2015 [employer] [place]. This information is not intended to replace advice given to you by your health care provider. Make sure you discuss any questions you have with your health care provider.

## 2014-04-25 DIAGNOSIS — R5381 Other malaise: Secondary | ICD-10-CM | POA: Diagnosis not present

## 2014-04-25 DIAGNOSIS — Z111 Encounter for screening for respiratory tuberculosis: Secondary | ICD-10-CM | POA: Diagnosis not present

## 2014-04-25 DIAGNOSIS — F250 Schizoaffective disorder, bipolar type: Secondary | ICD-10-CM | POA: Diagnosis not present

## 2014-04-25 DIAGNOSIS — G300 Alzheimer's disease with early onset: Secondary | ICD-10-CM | POA: Diagnosis not present

## 2014-04-25 DIAGNOSIS — I10 Essential (primary) hypertension: Secondary | ICD-10-CM | POA: Diagnosis not present

## 2014-05-05 DIAGNOSIS — F22 Delusional disorders: Secondary | ICD-10-CM | POA: Diagnosis not present

## 2014-05-05 DIAGNOSIS — G309 Alzheimer's disease, unspecified: Secondary | ICD-10-CM | POA: Diagnosis not present

## 2014-05-05 DIAGNOSIS — F259 Schizoaffective disorder, unspecified: Secondary | ICD-10-CM | POA: Diagnosis not present

## 2014-05-05 DIAGNOSIS — I10 Essential (primary) hypertension: Secondary | ICD-10-CM | POA: Diagnosis not present

## 2014-05-05 DIAGNOSIS — Z6820 Body mass index (BMI) 20.0-20.9, adult: Secondary | ICD-10-CM | POA: Diagnosis not present

## 2014-06-02 DIAGNOSIS — I10 Essential (primary) hypertension: Secondary | ICD-10-CM | POA: Diagnosis not present

## 2014-06-02 DIAGNOSIS — J209 Acute bronchitis, unspecified: Secondary | ICD-10-CM | POA: Diagnosis not present

## 2014-06-02 DIAGNOSIS — Z79899 Other long term (current) drug therapy: Secondary | ICD-10-CM | POA: Diagnosis not present

## 2014-06-02 DIAGNOSIS — F0281 Dementia in other diseases classified elsewhere with behavioral disturbance: Secondary | ICD-10-CM | POA: Diagnosis not present

## 2014-06-02 DIAGNOSIS — F259 Schizoaffective disorder, unspecified: Secondary | ICD-10-CM | POA: Diagnosis not present

## 2014-06-06 DIAGNOSIS — F259 Schizoaffective disorder, unspecified: Secondary | ICD-10-CM | POA: Diagnosis not present

## 2014-06-06 DIAGNOSIS — G309 Alzheimer's disease, unspecified: Secondary | ICD-10-CM | POA: Diagnosis not present

## 2014-06-06 DIAGNOSIS — J209 Acute bronchitis, unspecified: Secondary | ICD-10-CM | POA: Diagnosis not present

## 2014-06-06 DIAGNOSIS — I10 Essential (primary) hypertension: Secondary | ICD-10-CM | POA: Diagnosis not present

## 2014-06-06 DIAGNOSIS — Z79899 Other long term (current) drug therapy: Secondary | ICD-10-CM | POA: Diagnosis not present

## 2014-06-07 DIAGNOSIS — F259 Schizoaffective disorder, unspecified: Secondary | ICD-10-CM | POA: Diagnosis not present

## 2014-06-07 DIAGNOSIS — Z79899 Other long term (current) drug therapy: Secondary | ICD-10-CM | POA: Diagnosis not present

## 2014-06-20 DIAGNOSIS — M5000 Cervical disc disorder with myelopathy, unspecified cervical region: Secondary | ICD-10-CM | POA: Diagnosis not present

## 2014-06-20 DIAGNOSIS — F250 Schizoaffective disorder, bipolar type: Secondary | ICD-10-CM | POA: Diagnosis not present

## 2014-06-20 DIAGNOSIS — I10 Essential (primary) hypertension: Secondary | ICD-10-CM | POA: Diagnosis not present

## 2014-06-20 DIAGNOSIS — J309 Allergic rhinitis, unspecified: Secondary | ICD-10-CM | POA: Diagnosis not present

## 2014-06-20 DIAGNOSIS — M5136 Other intervertebral disc degeneration, lumbar region: Secondary | ICD-10-CM | POA: Diagnosis not present

## 2014-07-04 DIAGNOSIS — F250 Schizoaffective disorder, bipolar type: Secondary | ICD-10-CM | POA: Diagnosis not present

## 2014-07-04 DIAGNOSIS — I10 Essential (primary) hypertension: Secondary | ICD-10-CM | POA: Diagnosis not present

## 2014-07-04 DIAGNOSIS — J309 Allergic rhinitis, unspecified: Secondary | ICD-10-CM | POA: Diagnosis not present

## 2014-07-04 DIAGNOSIS — M5000 Cervical disc disorder with myelopathy, unspecified cervical region: Secondary | ICD-10-CM | POA: Diagnosis not present

## 2014-07-04 DIAGNOSIS — M5136 Other intervertebral disc degeneration, lumbar region: Secondary | ICD-10-CM | POA: Diagnosis not present

## 2014-08-01 DIAGNOSIS — I10 Essential (primary) hypertension: Secondary | ICD-10-CM | POA: Diagnosis not present

## 2014-08-01 DIAGNOSIS — F22 Delusional disorders: Secondary | ICD-10-CM | POA: Diagnosis not present

## 2014-08-01 DIAGNOSIS — F259 Schizoaffective disorder, unspecified: Secondary | ICD-10-CM | POA: Diagnosis not present

## 2014-08-01 DIAGNOSIS — M810 Age-related osteoporosis without current pathological fracture: Secondary | ICD-10-CM | POA: Diagnosis not present

## 2014-08-01 DIAGNOSIS — G309 Alzheimer's disease, unspecified: Secondary | ICD-10-CM | POA: Diagnosis not present

## 2014-08-08 DIAGNOSIS — J069 Acute upper respiratory infection, unspecified: Secondary | ICD-10-CM | POA: Diagnosis not present

## 2014-08-08 DIAGNOSIS — M5000 Cervical disc disorder with myelopathy, unspecified cervical region: Secondary | ICD-10-CM | POA: Diagnosis not present

## 2014-08-08 DIAGNOSIS — M5136 Other intervertebral disc degeneration, lumbar region: Secondary | ICD-10-CM | POA: Diagnosis not present

## 2014-09-05 DIAGNOSIS — M5136 Other intervertebral disc degeneration, lumbar region: Secondary | ICD-10-CM | POA: Diagnosis not present

## 2014-09-05 DIAGNOSIS — J309 Allergic rhinitis, unspecified: Secondary | ICD-10-CM | POA: Diagnosis not present

## 2014-09-05 DIAGNOSIS — D509 Iron deficiency anemia, unspecified: Secondary | ICD-10-CM | POA: Diagnosis not present

## 2014-09-05 DIAGNOSIS — M5000 Cervical disc disorder with myelopathy, unspecified cervical region: Secondary | ICD-10-CM | POA: Diagnosis not present

## 2014-09-20 DIAGNOSIS — G309 Alzheimer's disease, unspecified: Secondary | ICD-10-CM | POA: Diagnosis not present

## 2014-09-20 DIAGNOSIS — Z79899 Other long term (current) drug therapy: Secondary | ICD-10-CM | POA: Diagnosis not present

## 2014-10-03 DIAGNOSIS — M5000 Cervical disc disorder with myelopathy, unspecified cervical region: Secondary | ICD-10-CM | POA: Diagnosis not present

## 2014-10-03 DIAGNOSIS — M5136 Other intervertebral disc degeneration, lumbar region: Secondary | ICD-10-CM | POA: Diagnosis not present

## 2014-10-03 DIAGNOSIS — D509 Iron deficiency anemia, unspecified: Secondary | ICD-10-CM | POA: Diagnosis not present

## 2014-10-03 DIAGNOSIS — R54 Age-related physical debility: Secondary | ICD-10-CM | POA: Diagnosis not present

## 2014-10-03 DIAGNOSIS — J309 Allergic rhinitis, unspecified: Secondary | ICD-10-CM | POA: Diagnosis not present

## 2014-10-10 DIAGNOSIS — J309 Allergic rhinitis, unspecified: Secondary | ICD-10-CM | POA: Diagnosis not present

## 2014-10-10 DIAGNOSIS — M5000 Cervical disc disorder with myelopathy, unspecified cervical region: Secondary | ICD-10-CM | POA: Diagnosis not present

## 2014-10-10 DIAGNOSIS — N183 Chronic kidney disease, stage 3 (moderate): Secondary | ICD-10-CM | POA: Diagnosis not present

## 2014-10-10 DIAGNOSIS — M5136 Other intervertebral disc degeneration, lumbar region: Secondary | ICD-10-CM | POA: Diagnosis not present

## 2014-10-10 DIAGNOSIS — M8000XS Age-related osteoporosis with current pathological fracture, unspecified site, sequela: Secondary | ICD-10-CM | POA: Diagnosis not present

## 2014-10-25 DIAGNOSIS — Z0000 Encounter for general adult medical examination without abnormal findings: Secondary | ICD-10-CM | POA: Diagnosis not present

## 2014-11-07 DIAGNOSIS — M79676 Pain in unspecified toe(s): Secondary | ICD-10-CM | POA: Diagnosis not present

## 2014-11-07 DIAGNOSIS — M2010 Hallux valgus (acquired), unspecified foot: Secondary | ICD-10-CM | POA: Diagnosis not present

## 2014-11-07 DIAGNOSIS — R262 Difficulty in walking, not elsewhere classified: Secondary | ICD-10-CM | POA: Diagnosis not present

## 2014-11-07 DIAGNOSIS — M24576 Contracture, unspecified foot: Secondary | ICD-10-CM | POA: Diagnosis not present

## 2014-11-07 DIAGNOSIS — B351 Tinea unguium: Secondary | ICD-10-CM | POA: Diagnosis not present

## 2014-12-05 DIAGNOSIS — F259 Schizoaffective disorder, unspecified: Secondary | ICD-10-CM | POA: Diagnosis not present

## 2014-12-05 DIAGNOSIS — D649 Anemia, unspecified: Secondary | ICD-10-CM | POA: Diagnosis not present

## 2014-12-05 DIAGNOSIS — J309 Allergic rhinitis, unspecified: Secondary | ICD-10-CM | POA: Diagnosis not present

## 2014-12-05 DIAGNOSIS — G301 Alzheimer's disease with late onset: Secondary | ICD-10-CM | POA: Diagnosis not present

## 2014-12-05 DIAGNOSIS — I10 Essential (primary) hypertension: Secondary | ICD-10-CM | POA: Diagnosis not present

## 2014-12-19 DIAGNOSIS — F0281 Dementia in other diseases classified elsewhere with behavioral disturbance: Secondary | ICD-10-CM | POA: Diagnosis not present

## 2014-12-19 DIAGNOSIS — R2689 Other abnormalities of gait and mobility: Secondary | ICD-10-CM | POA: Diagnosis not present

## 2014-12-19 DIAGNOSIS — I10 Essential (primary) hypertension: Secondary | ICD-10-CM | POA: Diagnosis not present

## 2014-12-19 DIAGNOSIS — F329 Major depressive disorder, single episode, unspecified: Secondary | ICD-10-CM | POA: Diagnosis not present

## 2014-12-19 DIAGNOSIS — R278 Other lack of coordination: Secondary | ICD-10-CM | POA: Diagnosis not present

## 2014-12-19 DIAGNOSIS — M6281 Muscle weakness (generalized): Secondary | ICD-10-CM | POA: Diagnosis not present

## 2014-12-19 DIAGNOSIS — G301 Alzheimer's disease with late onset: Secondary | ICD-10-CM | POA: Diagnosis not present

## 2014-12-19 DIAGNOSIS — Z9181 History of falling: Secondary | ICD-10-CM | POA: Diagnosis not present

## 2014-12-20 DIAGNOSIS — M6281 Muscle weakness (generalized): Secondary | ICD-10-CM | POA: Diagnosis not present

## 2014-12-20 DIAGNOSIS — Z9181 History of falling: Secondary | ICD-10-CM | POA: Diagnosis not present

## 2014-12-20 DIAGNOSIS — R278 Other lack of coordination: Secondary | ICD-10-CM | POA: Diagnosis not present

## 2014-12-20 DIAGNOSIS — R2689 Other abnormalities of gait and mobility: Secondary | ICD-10-CM | POA: Diagnosis not present

## 2014-12-21 DIAGNOSIS — R278 Other lack of coordination: Secondary | ICD-10-CM | POA: Diagnosis not present

## 2014-12-21 DIAGNOSIS — R2689 Other abnormalities of gait and mobility: Secondary | ICD-10-CM | POA: Diagnosis not present

## 2014-12-21 DIAGNOSIS — Z9181 History of falling: Secondary | ICD-10-CM | POA: Diagnosis not present

## 2014-12-21 DIAGNOSIS — M6281 Muscle weakness (generalized): Secondary | ICD-10-CM | POA: Diagnosis not present

## 2014-12-22 DIAGNOSIS — R2689 Other abnormalities of gait and mobility: Secondary | ICD-10-CM | POA: Diagnosis not present

## 2014-12-22 DIAGNOSIS — M6281 Muscle weakness (generalized): Secondary | ICD-10-CM | POA: Diagnosis not present

## 2014-12-22 DIAGNOSIS — Z9181 History of falling: Secondary | ICD-10-CM | POA: Diagnosis not present

## 2014-12-22 DIAGNOSIS — R278 Other lack of coordination: Secondary | ICD-10-CM | POA: Diagnosis not present

## 2014-12-26 DIAGNOSIS — Z9181 History of falling: Secondary | ICD-10-CM | POA: Diagnosis not present

## 2014-12-26 DIAGNOSIS — R278 Other lack of coordination: Secondary | ICD-10-CM | POA: Diagnosis not present

## 2014-12-26 DIAGNOSIS — M6281 Muscle weakness (generalized): Secondary | ICD-10-CM | POA: Diagnosis not present

## 2014-12-26 DIAGNOSIS — R2689 Other abnormalities of gait and mobility: Secondary | ICD-10-CM | POA: Diagnosis not present

## 2014-12-27 DIAGNOSIS — R278 Other lack of coordination: Secondary | ICD-10-CM | POA: Diagnosis not present

## 2014-12-27 DIAGNOSIS — Z9181 History of falling: Secondary | ICD-10-CM | POA: Diagnosis not present

## 2014-12-27 DIAGNOSIS — M6281 Muscle weakness (generalized): Secondary | ICD-10-CM | POA: Diagnosis not present

## 2014-12-27 DIAGNOSIS — R2689 Other abnormalities of gait and mobility: Secondary | ICD-10-CM | POA: Diagnosis not present

## 2014-12-28 DIAGNOSIS — Z9181 History of falling: Secondary | ICD-10-CM | POA: Diagnosis not present

## 2014-12-28 DIAGNOSIS — M6281 Muscle weakness (generalized): Secondary | ICD-10-CM | POA: Diagnosis not present

## 2014-12-28 DIAGNOSIS — R278 Other lack of coordination: Secondary | ICD-10-CM | POA: Diagnosis not present

## 2014-12-28 DIAGNOSIS — R2689 Other abnormalities of gait and mobility: Secondary | ICD-10-CM | POA: Diagnosis not present

## 2014-12-29 DIAGNOSIS — R2689 Other abnormalities of gait and mobility: Secondary | ICD-10-CM | POA: Diagnosis not present

## 2014-12-29 DIAGNOSIS — R278 Other lack of coordination: Secondary | ICD-10-CM | POA: Diagnosis not present

## 2014-12-29 DIAGNOSIS — M6281 Muscle weakness (generalized): Secondary | ICD-10-CM | POA: Diagnosis not present

## 2014-12-29 DIAGNOSIS — Z9181 History of falling: Secondary | ICD-10-CM | POA: Diagnosis not present

## 2015-01-02 DIAGNOSIS — R2689 Other abnormalities of gait and mobility: Secondary | ICD-10-CM | POA: Diagnosis not present

## 2015-01-02 DIAGNOSIS — Z9181 History of falling: Secondary | ICD-10-CM | POA: Diagnosis not present

## 2015-01-02 DIAGNOSIS — R278 Other lack of coordination: Secondary | ICD-10-CM | POA: Diagnosis not present

## 2015-01-02 DIAGNOSIS — M6281 Muscle weakness (generalized): Secondary | ICD-10-CM | POA: Diagnosis not present

## 2015-01-03 DIAGNOSIS — R278 Other lack of coordination: Secondary | ICD-10-CM | POA: Diagnosis not present

## 2015-01-03 DIAGNOSIS — Z9181 History of falling: Secondary | ICD-10-CM | POA: Diagnosis not present

## 2015-01-03 DIAGNOSIS — Z23 Encounter for immunization: Secondary | ICD-10-CM | POA: Diagnosis not present

## 2015-01-03 DIAGNOSIS — M6281 Muscle weakness (generalized): Secondary | ICD-10-CM | POA: Diagnosis not present

## 2015-01-03 DIAGNOSIS — Z111 Encounter for screening for respiratory tuberculosis: Secondary | ICD-10-CM | POA: Diagnosis not present

## 2015-01-03 DIAGNOSIS — R2689 Other abnormalities of gait and mobility: Secondary | ICD-10-CM | POA: Diagnosis not present

## 2015-01-05 DIAGNOSIS — R2689 Other abnormalities of gait and mobility: Secondary | ICD-10-CM | POA: Diagnosis not present

## 2015-01-05 DIAGNOSIS — Z9181 History of falling: Secondary | ICD-10-CM | POA: Diagnosis not present

## 2015-01-05 DIAGNOSIS — M6281 Muscle weakness (generalized): Secondary | ICD-10-CM | POA: Diagnosis not present

## 2015-01-05 DIAGNOSIS — R278 Other lack of coordination: Secondary | ICD-10-CM | POA: Diagnosis not present

## 2015-01-08 DIAGNOSIS — M6281 Muscle weakness (generalized): Secondary | ICD-10-CM | POA: Diagnosis not present

## 2015-01-08 DIAGNOSIS — R2689 Other abnormalities of gait and mobility: Secondary | ICD-10-CM | POA: Diagnosis not present

## 2015-01-08 DIAGNOSIS — R278 Other lack of coordination: Secondary | ICD-10-CM | POA: Diagnosis not present

## 2015-01-08 DIAGNOSIS — Z9181 History of falling: Secondary | ICD-10-CM | POA: Diagnosis not present

## 2015-01-09 DIAGNOSIS — R2689 Other abnormalities of gait and mobility: Secondary | ICD-10-CM | POA: Diagnosis not present

## 2015-01-09 DIAGNOSIS — R278 Other lack of coordination: Secondary | ICD-10-CM | POA: Diagnosis not present

## 2015-01-09 DIAGNOSIS — Z9181 History of falling: Secondary | ICD-10-CM | POA: Diagnosis not present

## 2015-01-09 DIAGNOSIS — M6281 Muscle weakness (generalized): Secondary | ICD-10-CM | POA: Diagnosis not present

## 2015-01-10 DIAGNOSIS — M6281 Muscle weakness (generalized): Secondary | ICD-10-CM | POA: Diagnosis not present

## 2015-01-10 DIAGNOSIS — R2689 Other abnormalities of gait and mobility: Secondary | ICD-10-CM | POA: Diagnosis not present

## 2015-01-10 DIAGNOSIS — R278 Other lack of coordination: Secondary | ICD-10-CM | POA: Diagnosis not present

## 2015-01-10 DIAGNOSIS — Z9181 History of falling: Secondary | ICD-10-CM | POA: Diagnosis not present

## 2015-01-11 DIAGNOSIS — R2689 Other abnormalities of gait and mobility: Secondary | ICD-10-CM | POA: Diagnosis not present

## 2015-01-11 DIAGNOSIS — M6281 Muscle weakness (generalized): Secondary | ICD-10-CM | POA: Diagnosis not present

## 2015-01-11 DIAGNOSIS — R278 Other lack of coordination: Secondary | ICD-10-CM | POA: Diagnosis not present

## 2015-01-11 DIAGNOSIS — Z9181 History of falling: Secondary | ICD-10-CM | POA: Diagnosis not present

## 2015-01-12 DIAGNOSIS — F0390 Unspecified dementia without behavioral disturbance: Secondary | ICD-10-CM | POA: Diagnosis not present

## 2015-01-12 DIAGNOSIS — F331 Major depressive disorder, recurrent, moderate: Secondary | ICD-10-CM | POA: Diagnosis not present

## 2015-01-12 DIAGNOSIS — Z9181 History of falling: Secondary | ICD-10-CM | POA: Diagnosis not present

## 2015-01-12 DIAGNOSIS — M6281 Muscle weakness (generalized): Secondary | ICD-10-CM | POA: Diagnosis not present

## 2015-01-12 DIAGNOSIS — R2689 Other abnormalities of gait and mobility: Secondary | ICD-10-CM | POA: Diagnosis not present

## 2015-01-12 DIAGNOSIS — I10 Essential (primary) hypertension: Secondary | ICD-10-CM | POA: Diagnosis not present

## 2015-01-12 DIAGNOSIS — J309 Allergic rhinitis, unspecified: Secondary | ICD-10-CM | POA: Diagnosis not present

## 2015-01-12 DIAGNOSIS — R278 Other lack of coordination: Secondary | ICD-10-CM | POA: Diagnosis not present

## 2015-01-15 DIAGNOSIS — R278 Other lack of coordination: Secondary | ICD-10-CM | POA: Diagnosis not present

## 2015-01-15 DIAGNOSIS — R2689 Other abnormalities of gait and mobility: Secondary | ICD-10-CM | POA: Diagnosis not present

## 2015-01-15 DIAGNOSIS — M6281 Muscle weakness (generalized): Secondary | ICD-10-CM | POA: Diagnosis not present

## 2015-01-15 DIAGNOSIS — Z9181 History of falling: Secondary | ICD-10-CM | POA: Diagnosis not present

## 2015-01-16 DIAGNOSIS — R2689 Other abnormalities of gait and mobility: Secondary | ICD-10-CM | POA: Diagnosis not present

## 2015-01-16 DIAGNOSIS — Z9181 History of falling: Secondary | ICD-10-CM | POA: Diagnosis not present

## 2015-01-16 DIAGNOSIS — R278 Other lack of coordination: Secondary | ICD-10-CM | POA: Diagnosis not present

## 2015-01-16 DIAGNOSIS — M6281 Muscle weakness (generalized): Secondary | ICD-10-CM | POA: Diagnosis not present

## 2015-01-17 DIAGNOSIS — N189 Chronic kidney disease, unspecified: Secondary | ICD-10-CM | POA: Diagnosis not present

## 2015-01-17 DIAGNOSIS — I4891 Unspecified atrial fibrillation: Secondary | ICD-10-CM | POA: Diagnosis not present

## 2015-01-17 DIAGNOSIS — R2681 Unsteadiness on feet: Secondary | ICD-10-CM | POA: Diagnosis not present

## 2015-01-17 DIAGNOSIS — M6281 Muscle weakness (generalized): Secondary | ICD-10-CM | POA: Diagnosis not present

## 2015-01-17 DIAGNOSIS — D649 Anemia, unspecified: Secondary | ICD-10-CM | POA: Diagnosis not present

## 2015-01-17 DIAGNOSIS — F0390 Unspecified dementia without behavioral disturbance: Secondary | ICD-10-CM | POA: Diagnosis not present

## 2015-01-17 DIAGNOSIS — I129 Hypertensive chronic kidney disease with stage 1 through stage 4 chronic kidney disease, or unspecified chronic kidney disease: Secondary | ICD-10-CM | POA: Diagnosis not present

## 2015-01-17 DIAGNOSIS — I359 Nonrheumatic aortic valve disorder, unspecified: Secondary | ICD-10-CM | POA: Diagnosis not present

## 2015-01-17 DIAGNOSIS — Z9181 History of falling: Secondary | ICD-10-CM | POA: Diagnosis not present

## 2015-01-17 DIAGNOSIS — E785 Hyperlipidemia, unspecified: Secondary | ICD-10-CM | POA: Diagnosis not present

## 2015-01-22 DIAGNOSIS — I4891 Unspecified atrial fibrillation: Secondary | ICD-10-CM | POA: Diagnosis not present

## 2015-01-22 DIAGNOSIS — D649 Anemia, unspecified: Secondary | ICD-10-CM | POA: Diagnosis not present

## 2015-01-22 DIAGNOSIS — R2681 Unsteadiness on feet: Secondary | ICD-10-CM | POA: Diagnosis not present

## 2015-01-22 DIAGNOSIS — F0390 Unspecified dementia without behavioral disturbance: Secondary | ICD-10-CM | POA: Diagnosis not present

## 2015-01-22 DIAGNOSIS — N189 Chronic kidney disease, unspecified: Secondary | ICD-10-CM | POA: Diagnosis not present

## 2015-01-22 DIAGNOSIS — I359 Nonrheumatic aortic valve disorder, unspecified: Secondary | ICD-10-CM | POA: Diagnosis not present

## 2015-01-22 DIAGNOSIS — E785 Hyperlipidemia, unspecified: Secondary | ICD-10-CM | POA: Diagnosis not present

## 2015-01-22 DIAGNOSIS — I129 Hypertensive chronic kidney disease with stage 1 through stage 4 chronic kidney disease, or unspecified chronic kidney disease: Secondary | ICD-10-CM | POA: Diagnosis not present

## 2015-01-22 DIAGNOSIS — Z9181 History of falling: Secondary | ICD-10-CM | POA: Diagnosis not present

## 2015-01-22 DIAGNOSIS — M6281 Muscle weakness (generalized): Secondary | ICD-10-CM | POA: Diagnosis not present

## 2015-01-23 DIAGNOSIS — Z9181 History of falling: Secondary | ICD-10-CM | POA: Diagnosis not present

## 2015-01-23 DIAGNOSIS — R2681 Unsteadiness on feet: Secondary | ICD-10-CM | POA: Diagnosis not present

## 2015-01-23 DIAGNOSIS — I359 Nonrheumatic aortic valve disorder, unspecified: Secondary | ICD-10-CM | POA: Diagnosis not present

## 2015-01-23 DIAGNOSIS — I129 Hypertensive chronic kidney disease with stage 1 through stage 4 chronic kidney disease, or unspecified chronic kidney disease: Secondary | ICD-10-CM | POA: Diagnosis not present

## 2015-01-23 DIAGNOSIS — N189 Chronic kidney disease, unspecified: Secondary | ICD-10-CM | POA: Diagnosis not present

## 2015-01-23 DIAGNOSIS — I4891 Unspecified atrial fibrillation: Secondary | ICD-10-CM | POA: Diagnosis not present

## 2015-01-23 DIAGNOSIS — M6281 Muscle weakness (generalized): Secondary | ICD-10-CM | POA: Diagnosis not present

## 2015-01-23 DIAGNOSIS — E785 Hyperlipidemia, unspecified: Secondary | ICD-10-CM | POA: Diagnosis not present

## 2015-01-23 DIAGNOSIS — F0390 Unspecified dementia without behavioral disturbance: Secondary | ICD-10-CM | POA: Diagnosis not present

## 2015-01-23 DIAGNOSIS — D649 Anemia, unspecified: Secondary | ICD-10-CM | POA: Diagnosis not present

## 2015-01-24 DIAGNOSIS — M6281 Muscle weakness (generalized): Secondary | ICD-10-CM | POA: Diagnosis not present

## 2015-01-24 DIAGNOSIS — Z9181 History of falling: Secondary | ICD-10-CM | POA: Diagnosis not present

## 2015-01-24 DIAGNOSIS — R2681 Unsteadiness on feet: Secondary | ICD-10-CM | POA: Diagnosis not present

## 2015-01-24 DIAGNOSIS — I129 Hypertensive chronic kidney disease with stage 1 through stage 4 chronic kidney disease, or unspecified chronic kidney disease: Secondary | ICD-10-CM | POA: Diagnosis not present

## 2015-01-24 DIAGNOSIS — E785 Hyperlipidemia, unspecified: Secondary | ICD-10-CM | POA: Diagnosis not present

## 2015-01-24 DIAGNOSIS — I4891 Unspecified atrial fibrillation: Secondary | ICD-10-CM | POA: Diagnosis not present

## 2015-01-24 DIAGNOSIS — D649 Anemia, unspecified: Secondary | ICD-10-CM | POA: Diagnosis not present

## 2015-01-24 DIAGNOSIS — I359 Nonrheumatic aortic valve disorder, unspecified: Secondary | ICD-10-CM | POA: Diagnosis not present

## 2015-01-24 DIAGNOSIS — N189 Chronic kidney disease, unspecified: Secondary | ICD-10-CM | POA: Diagnosis not present

## 2015-01-24 DIAGNOSIS — F0390 Unspecified dementia without behavioral disturbance: Secondary | ICD-10-CM | POA: Diagnosis not present

## 2015-01-30 DIAGNOSIS — R2681 Unsteadiness on feet: Secondary | ICD-10-CM | POA: Diagnosis not present

## 2015-01-30 DIAGNOSIS — F0390 Unspecified dementia without behavioral disturbance: Secondary | ICD-10-CM | POA: Diagnosis not present

## 2015-01-30 DIAGNOSIS — M6281 Muscle weakness (generalized): Secondary | ICD-10-CM | POA: Diagnosis not present

## 2015-01-30 DIAGNOSIS — Z9181 History of falling: Secondary | ICD-10-CM | POA: Diagnosis not present

## 2015-01-30 DIAGNOSIS — N189 Chronic kidney disease, unspecified: Secondary | ICD-10-CM | POA: Diagnosis not present

## 2015-01-30 DIAGNOSIS — I129 Hypertensive chronic kidney disease with stage 1 through stage 4 chronic kidney disease, or unspecified chronic kidney disease: Secondary | ICD-10-CM | POA: Diagnosis not present

## 2015-01-30 DIAGNOSIS — I359 Nonrheumatic aortic valve disorder, unspecified: Secondary | ICD-10-CM | POA: Diagnosis not present

## 2015-01-30 DIAGNOSIS — D649 Anemia, unspecified: Secondary | ICD-10-CM | POA: Diagnosis not present

## 2015-01-30 DIAGNOSIS — E785 Hyperlipidemia, unspecified: Secondary | ICD-10-CM | POA: Diagnosis not present

## 2015-01-30 DIAGNOSIS — I4891 Unspecified atrial fibrillation: Secondary | ICD-10-CM | POA: Diagnosis not present

## 2015-01-31 DIAGNOSIS — N189 Chronic kidney disease, unspecified: Secondary | ICD-10-CM | POA: Diagnosis not present

## 2015-01-31 DIAGNOSIS — M6281 Muscle weakness (generalized): Secondary | ICD-10-CM | POA: Diagnosis not present

## 2015-01-31 DIAGNOSIS — I359 Nonrheumatic aortic valve disorder, unspecified: Secondary | ICD-10-CM | POA: Diagnosis not present

## 2015-01-31 DIAGNOSIS — I4891 Unspecified atrial fibrillation: Secondary | ICD-10-CM | POA: Diagnosis not present

## 2015-01-31 DIAGNOSIS — D649 Anemia, unspecified: Secondary | ICD-10-CM | POA: Diagnosis not present

## 2015-01-31 DIAGNOSIS — E785 Hyperlipidemia, unspecified: Secondary | ICD-10-CM | POA: Diagnosis not present

## 2015-01-31 DIAGNOSIS — R2681 Unsteadiness on feet: Secondary | ICD-10-CM | POA: Diagnosis not present

## 2015-01-31 DIAGNOSIS — I129 Hypertensive chronic kidney disease with stage 1 through stage 4 chronic kidney disease, or unspecified chronic kidney disease: Secondary | ICD-10-CM | POA: Diagnosis not present

## 2015-01-31 DIAGNOSIS — F0390 Unspecified dementia without behavioral disturbance: Secondary | ICD-10-CM | POA: Diagnosis not present

## 2015-01-31 DIAGNOSIS — Z9181 History of falling: Secondary | ICD-10-CM | POA: Diagnosis not present

## 2015-02-02 DIAGNOSIS — N189 Chronic kidney disease, unspecified: Secondary | ICD-10-CM | POA: Diagnosis not present

## 2015-02-02 DIAGNOSIS — E785 Hyperlipidemia, unspecified: Secondary | ICD-10-CM | POA: Diagnosis not present

## 2015-02-02 DIAGNOSIS — D649 Anemia, unspecified: Secondary | ICD-10-CM | POA: Diagnosis not present

## 2015-02-02 DIAGNOSIS — I129 Hypertensive chronic kidney disease with stage 1 through stage 4 chronic kidney disease, or unspecified chronic kidney disease: Secondary | ICD-10-CM | POA: Diagnosis not present

## 2015-02-02 DIAGNOSIS — R2681 Unsteadiness on feet: Secondary | ICD-10-CM | POA: Diagnosis not present

## 2015-02-02 DIAGNOSIS — I4891 Unspecified atrial fibrillation: Secondary | ICD-10-CM | POA: Diagnosis not present

## 2015-02-02 DIAGNOSIS — Z9181 History of falling: Secondary | ICD-10-CM | POA: Diagnosis not present

## 2015-02-02 DIAGNOSIS — M6281 Muscle weakness (generalized): Secondary | ICD-10-CM | POA: Diagnosis not present

## 2015-02-02 DIAGNOSIS — F0390 Unspecified dementia without behavioral disturbance: Secondary | ICD-10-CM | POA: Diagnosis not present

## 2015-02-02 DIAGNOSIS — I359 Nonrheumatic aortic valve disorder, unspecified: Secondary | ICD-10-CM | POA: Diagnosis not present

## 2015-02-06 DIAGNOSIS — R2681 Unsteadiness on feet: Secondary | ICD-10-CM | POA: Diagnosis not present

## 2015-02-06 DIAGNOSIS — D649 Anemia, unspecified: Secondary | ICD-10-CM | POA: Diagnosis not present

## 2015-02-06 DIAGNOSIS — I129 Hypertensive chronic kidney disease with stage 1 through stage 4 chronic kidney disease, or unspecified chronic kidney disease: Secondary | ICD-10-CM | POA: Diagnosis not present

## 2015-02-06 DIAGNOSIS — Z9181 History of falling: Secondary | ICD-10-CM | POA: Diagnosis not present

## 2015-02-06 DIAGNOSIS — N189 Chronic kidney disease, unspecified: Secondary | ICD-10-CM | POA: Diagnosis not present

## 2015-02-06 DIAGNOSIS — M6281 Muscle weakness (generalized): Secondary | ICD-10-CM | POA: Diagnosis not present

## 2015-02-06 DIAGNOSIS — I4891 Unspecified atrial fibrillation: Secondary | ICD-10-CM | POA: Diagnosis not present

## 2015-02-06 DIAGNOSIS — F0390 Unspecified dementia without behavioral disturbance: Secondary | ICD-10-CM | POA: Diagnosis not present

## 2015-02-06 DIAGNOSIS — E785 Hyperlipidemia, unspecified: Secondary | ICD-10-CM | POA: Diagnosis not present

## 2015-02-06 DIAGNOSIS — I359 Nonrheumatic aortic valve disorder, unspecified: Secondary | ICD-10-CM | POA: Diagnosis not present

## 2015-02-07 DIAGNOSIS — R2681 Unsteadiness on feet: Secondary | ICD-10-CM | POA: Diagnosis not present

## 2015-02-07 DIAGNOSIS — I4891 Unspecified atrial fibrillation: Secondary | ICD-10-CM | POA: Diagnosis not present

## 2015-02-07 DIAGNOSIS — F0390 Unspecified dementia without behavioral disturbance: Secondary | ICD-10-CM | POA: Diagnosis not present

## 2015-02-07 DIAGNOSIS — E785 Hyperlipidemia, unspecified: Secondary | ICD-10-CM | POA: Diagnosis not present

## 2015-02-07 DIAGNOSIS — I10 Essential (primary) hypertension: Secondary | ICD-10-CM | POA: Diagnosis not present

## 2015-02-07 DIAGNOSIS — N189 Chronic kidney disease, unspecified: Secondary | ICD-10-CM | POA: Diagnosis not present

## 2015-02-07 DIAGNOSIS — I129 Hypertensive chronic kidney disease with stage 1 through stage 4 chronic kidney disease, or unspecified chronic kidney disease: Secondary | ICD-10-CM | POA: Diagnosis not present

## 2015-02-07 DIAGNOSIS — Z9181 History of falling: Secondary | ICD-10-CM | POA: Diagnosis not present

## 2015-02-07 DIAGNOSIS — M6281 Muscle weakness (generalized): Secondary | ICD-10-CM | POA: Diagnosis not present

## 2015-02-07 DIAGNOSIS — F331 Major depressive disorder, recurrent, moderate: Secondary | ICD-10-CM | POA: Diagnosis not present

## 2015-02-07 DIAGNOSIS — D649 Anemia, unspecified: Secondary | ICD-10-CM | POA: Diagnosis not present

## 2015-02-07 DIAGNOSIS — I359 Nonrheumatic aortic valve disorder, unspecified: Secondary | ICD-10-CM | POA: Diagnosis not present

## 2015-02-07 DIAGNOSIS — J309 Allergic rhinitis, unspecified: Secondary | ICD-10-CM | POA: Diagnosis not present

## 2015-02-08 DIAGNOSIS — D649 Anemia, unspecified: Secondary | ICD-10-CM | POA: Diagnosis not present

## 2015-02-08 DIAGNOSIS — R2681 Unsteadiness on feet: Secondary | ICD-10-CM | POA: Diagnosis not present

## 2015-02-08 DIAGNOSIS — I129 Hypertensive chronic kidney disease with stage 1 through stage 4 chronic kidney disease, or unspecified chronic kidney disease: Secondary | ICD-10-CM | POA: Diagnosis not present

## 2015-02-08 DIAGNOSIS — F0390 Unspecified dementia without behavioral disturbance: Secondary | ICD-10-CM | POA: Diagnosis not present

## 2015-02-08 DIAGNOSIS — Z9181 History of falling: Secondary | ICD-10-CM | POA: Diagnosis not present

## 2015-02-08 DIAGNOSIS — I359 Nonrheumatic aortic valve disorder, unspecified: Secondary | ICD-10-CM | POA: Diagnosis not present

## 2015-02-08 DIAGNOSIS — M6281 Muscle weakness (generalized): Secondary | ICD-10-CM | POA: Diagnosis not present

## 2015-02-08 DIAGNOSIS — N189 Chronic kidney disease, unspecified: Secondary | ICD-10-CM | POA: Diagnosis not present

## 2015-02-08 DIAGNOSIS — I4891 Unspecified atrial fibrillation: Secondary | ICD-10-CM | POA: Diagnosis not present

## 2015-02-08 DIAGNOSIS — E785 Hyperlipidemia, unspecified: Secondary | ICD-10-CM | POA: Diagnosis not present

## 2015-02-09 DIAGNOSIS — I129 Hypertensive chronic kidney disease with stage 1 through stage 4 chronic kidney disease, or unspecified chronic kidney disease: Secondary | ICD-10-CM | POA: Diagnosis not present

## 2015-02-09 DIAGNOSIS — I359 Nonrheumatic aortic valve disorder, unspecified: Secondary | ICD-10-CM | POA: Diagnosis not present

## 2015-02-09 DIAGNOSIS — N189 Chronic kidney disease, unspecified: Secondary | ICD-10-CM | POA: Diagnosis not present

## 2015-02-09 DIAGNOSIS — Z9181 History of falling: Secondary | ICD-10-CM | POA: Diagnosis not present

## 2015-02-09 DIAGNOSIS — I4891 Unspecified atrial fibrillation: Secondary | ICD-10-CM | POA: Diagnosis not present

## 2015-02-09 DIAGNOSIS — F0390 Unspecified dementia without behavioral disturbance: Secondary | ICD-10-CM | POA: Diagnosis not present

## 2015-02-09 DIAGNOSIS — E785 Hyperlipidemia, unspecified: Secondary | ICD-10-CM | POA: Diagnosis not present

## 2015-02-09 DIAGNOSIS — R2681 Unsteadiness on feet: Secondary | ICD-10-CM | POA: Diagnosis not present

## 2015-02-09 DIAGNOSIS — D649 Anemia, unspecified: Secondary | ICD-10-CM | POA: Diagnosis not present

## 2015-02-09 DIAGNOSIS — M6281 Muscle weakness (generalized): Secondary | ICD-10-CM | POA: Diagnosis not present

## 2015-02-13 DIAGNOSIS — D649 Anemia, unspecified: Secondary | ICD-10-CM | POA: Diagnosis not present

## 2015-02-13 DIAGNOSIS — I4891 Unspecified atrial fibrillation: Secondary | ICD-10-CM | POA: Diagnosis not present

## 2015-02-13 DIAGNOSIS — R2681 Unsteadiness on feet: Secondary | ICD-10-CM | POA: Diagnosis not present

## 2015-02-13 DIAGNOSIS — N189 Chronic kidney disease, unspecified: Secondary | ICD-10-CM | POA: Diagnosis not present

## 2015-02-13 DIAGNOSIS — M6281 Muscle weakness (generalized): Secondary | ICD-10-CM | POA: Diagnosis not present

## 2015-02-13 DIAGNOSIS — I129 Hypertensive chronic kidney disease with stage 1 through stage 4 chronic kidney disease, or unspecified chronic kidney disease: Secondary | ICD-10-CM | POA: Diagnosis not present

## 2015-02-13 DIAGNOSIS — E785 Hyperlipidemia, unspecified: Secondary | ICD-10-CM | POA: Diagnosis not present

## 2015-02-13 DIAGNOSIS — Z9181 History of falling: Secondary | ICD-10-CM | POA: Diagnosis not present

## 2015-02-13 DIAGNOSIS — F0390 Unspecified dementia without behavioral disturbance: Secondary | ICD-10-CM | POA: Diagnosis not present

## 2015-02-13 DIAGNOSIS — I359 Nonrheumatic aortic valve disorder, unspecified: Secondary | ICD-10-CM | POA: Diagnosis not present

## 2015-02-15 DIAGNOSIS — I359 Nonrheumatic aortic valve disorder, unspecified: Secondary | ICD-10-CM | POA: Diagnosis not present

## 2015-02-15 DIAGNOSIS — F0390 Unspecified dementia without behavioral disturbance: Secondary | ICD-10-CM | POA: Diagnosis not present

## 2015-02-15 DIAGNOSIS — E785 Hyperlipidemia, unspecified: Secondary | ICD-10-CM | POA: Diagnosis not present

## 2015-02-15 DIAGNOSIS — Z9181 History of falling: Secondary | ICD-10-CM | POA: Diagnosis not present

## 2015-02-15 DIAGNOSIS — R2681 Unsteadiness on feet: Secondary | ICD-10-CM | POA: Diagnosis not present

## 2015-02-15 DIAGNOSIS — N189 Chronic kidney disease, unspecified: Secondary | ICD-10-CM | POA: Diagnosis not present

## 2015-02-15 DIAGNOSIS — I4891 Unspecified atrial fibrillation: Secondary | ICD-10-CM | POA: Diagnosis not present

## 2015-02-15 DIAGNOSIS — D649 Anemia, unspecified: Secondary | ICD-10-CM | POA: Diagnosis not present

## 2015-02-15 DIAGNOSIS — I129 Hypertensive chronic kidney disease with stage 1 through stage 4 chronic kidney disease, or unspecified chronic kidney disease: Secondary | ICD-10-CM | POA: Diagnosis not present

## 2015-02-15 DIAGNOSIS — M6281 Muscle weakness (generalized): Secondary | ICD-10-CM | POA: Diagnosis not present

## 2015-02-15 DIAGNOSIS — B351 Tinea unguium: Secondary | ICD-10-CM | POA: Diagnosis not present

## 2015-02-16 DIAGNOSIS — M6281 Muscle weakness (generalized): Secondary | ICD-10-CM | POA: Diagnosis not present

## 2015-02-16 DIAGNOSIS — E785 Hyperlipidemia, unspecified: Secondary | ICD-10-CM | POA: Diagnosis not present

## 2015-02-16 DIAGNOSIS — I129 Hypertensive chronic kidney disease with stage 1 through stage 4 chronic kidney disease, or unspecified chronic kidney disease: Secondary | ICD-10-CM | POA: Diagnosis not present

## 2015-02-16 DIAGNOSIS — R2681 Unsteadiness on feet: Secondary | ICD-10-CM | POA: Diagnosis not present

## 2015-02-16 DIAGNOSIS — D649 Anemia, unspecified: Secondary | ICD-10-CM | POA: Diagnosis not present

## 2015-02-16 DIAGNOSIS — F0390 Unspecified dementia without behavioral disturbance: Secondary | ICD-10-CM | POA: Diagnosis not present

## 2015-02-16 DIAGNOSIS — Z9181 History of falling: Secondary | ICD-10-CM | POA: Diagnosis not present

## 2015-02-16 DIAGNOSIS — N189 Chronic kidney disease, unspecified: Secondary | ICD-10-CM | POA: Diagnosis not present

## 2015-02-16 DIAGNOSIS — I4891 Unspecified atrial fibrillation: Secondary | ICD-10-CM | POA: Diagnosis not present

## 2015-02-16 DIAGNOSIS — I359 Nonrheumatic aortic valve disorder, unspecified: Secondary | ICD-10-CM | POA: Diagnosis not present

## 2015-02-20 DIAGNOSIS — I129 Hypertensive chronic kidney disease with stage 1 through stage 4 chronic kidney disease, or unspecified chronic kidney disease: Secondary | ICD-10-CM | POA: Diagnosis not present

## 2015-02-20 DIAGNOSIS — Z9181 History of falling: Secondary | ICD-10-CM | POA: Diagnosis not present

## 2015-02-20 DIAGNOSIS — N189 Chronic kidney disease, unspecified: Secondary | ICD-10-CM | POA: Diagnosis not present

## 2015-02-20 DIAGNOSIS — I4891 Unspecified atrial fibrillation: Secondary | ICD-10-CM | POA: Diagnosis not present

## 2015-02-20 DIAGNOSIS — D649 Anemia, unspecified: Secondary | ICD-10-CM | POA: Diagnosis not present

## 2015-02-20 DIAGNOSIS — I359 Nonrheumatic aortic valve disorder, unspecified: Secondary | ICD-10-CM | POA: Diagnosis not present

## 2015-02-20 DIAGNOSIS — F0390 Unspecified dementia without behavioral disturbance: Secondary | ICD-10-CM | POA: Diagnosis not present

## 2015-02-20 DIAGNOSIS — R2681 Unsteadiness on feet: Secondary | ICD-10-CM | POA: Diagnosis not present

## 2015-02-20 DIAGNOSIS — M6281 Muscle weakness (generalized): Secondary | ICD-10-CM | POA: Diagnosis not present

## 2015-02-20 DIAGNOSIS — E785 Hyperlipidemia, unspecified: Secondary | ICD-10-CM | POA: Diagnosis not present

## 2015-02-21 DIAGNOSIS — N189 Chronic kidney disease, unspecified: Secondary | ICD-10-CM | POA: Diagnosis not present

## 2015-02-21 DIAGNOSIS — I359 Nonrheumatic aortic valve disorder, unspecified: Secondary | ICD-10-CM | POA: Diagnosis not present

## 2015-02-21 DIAGNOSIS — R2681 Unsteadiness on feet: Secondary | ICD-10-CM | POA: Diagnosis not present

## 2015-02-21 DIAGNOSIS — I4891 Unspecified atrial fibrillation: Secondary | ICD-10-CM | POA: Diagnosis not present

## 2015-02-21 DIAGNOSIS — D649 Anemia, unspecified: Secondary | ICD-10-CM | POA: Diagnosis not present

## 2015-02-21 DIAGNOSIS — I129 Hypertensive chronic kidney disease with stage 1 through stage 4 chronic kidney disease, or unspecified chronic kidney disease: Secondary | ICD-10-CM | POA: Diagnosis not present

## 2015-02-21 DIAGNOSIS — F0390 Unspecified dementia without behavioral disturbance: Secondary | ICD-10-CM | POA: Diagnosis not present

## 2015-02-21 DIAGNOSIS — M6281 Muscle weakness (generalized): Secondary | ICD-10-CM | POA: Diagnosis not present

## 2015-02-21 DIAGNOSIS — Z9181 History of falling: Secondary | ICD-10-CM | POA: Diagnosis not present

## 2015-02-21 DIAGNOSIS — E785 Hyperlipidemia, unspecified: Secondary | ICD-10-CM | POA: Diagnosis not present

## 2015-02-28 DIAGNOSIS — M6281 Muscle weakness (generalized): Secondary | ICD-10-CM | POA: Diagnosis not present

## 2015-02-28 DIAGNOSIS — R2681 Unsteadiness on feet: Secondary | ICD-10-CM | POA: Diagnosis not present

## 2015-02-28 DIAGNOSIS — I359 Nonrheumatic aortic valve disorder, unspecified: Secondary | ICD-10-CM | POA: Diagnosis not present

## 2015-02-28 DIAGNOSIS — N189 Chronic kidney disease, unspecified: Secondary | ICD-10-CM | POA: Diagnosis not present

## 2015-02-28 DIAGNOSIS — F0390 Unspecified dementia without behavioral disturbance: Secondary | ICD-10-CM | POA: Diagnosis not present

## 2015-02-28 DIAGNOSIS — I129 Hypertensive chronic kidney disease with stage 1 through stage 4 chronic kidney disease, or unspecified chronic kidney disease: Secondary | ICD-10-CM | POA: Diagnosis not present

## 2015-02-28 DIAGNOSIS — I4891 Unspecified atrial fibrillation: Secondary | ICD-10-CM | POA: Diagnosis not present

## 2015-02-28 DIAGNOSIS — Z9181 History of falling: Secondary | ICD-10-CM | POA: Diagnosis not present

## 2015-02-28 DIAGNOSIS — D649 Anemia, unspecified: Secondary | ICD-10-CM | POA: Diagnosis not present

## 2015-02-28 DIAGNOSIS — E785 Hyperlipidemia, unspecified: Secondary | ICD-10-CM | POA: Diagnosis not present

## 2015-03-02 DIAGNOSIS — M6281 Muscle weakness (generalized): Secondary | ICD-10-CM | POA: Diagnosis not present

## 2015-03-02 DIAGNOSIS — R2681 Unsteadiness on feet: Secondary | ICD-10-CM | POA: Diagnosis not present

## 2015-03-02 DIAGNOSIS — N189 Chronic kidney disease, unspecified: Secondary | ICD-10-CM | POA: Diagnosis not present

## 2015-03-02 DIAGNOSIS — I129 Hypertensive chronic kidney disease with stage 1 through stage 4 chronic kidney disease, or unspecified chronic kidney disease: Secondary | ICD-10-CM | POA: Diagnosis not present

## 2015-03-03 DIAGNOSIS — N189 Chronic kidney disease, unspecified: Secondary | ICD-10-CM | POA: Diagnosis not present

## 2015-03-03 DIAGNOSIS — I4891 Unspecified atrial fibrillation: Secondary | ICD-10-CM | POA: Diagnosis not present

## 2015-03-03 DIAGNOSIS — E782 Mixed hyperlipidemia: Secondary | ICD-10-CM | POA: Diagnosis not present

## 2015-03-03 DIAGNOSIS — F0390 Unspecified dementia without behavioral disturbance: Secondary | ICD-10-CM | POA: Diagnosis not present

## 2015-03-03 DIAGNOSIS — I358 Other nonrheumatic aortic valve disorders: Secondary | ICD-10-CM | POA: Diagnosis not present

## 2015-03-08 DIAGNOSIS — N189 Chronic kidney disease, unspecified: Secondary | ICD-10-CM | POA: Diagnosis not present

## 2015-03-08 DIAGNOSIS — M6281 Muscle weakness (generalized): Secondary | ICD-10-CM | POA: Diagnosis not present

## 2015-03-08 DIAGNOSIS — R2681 Unsteadiness on feet: Secondary | ICD-10-CM | POA: Diagnosis not present

## 2015-03-08 DIAGNOSIS — I129 Hypertensive chronic kidney disease with stage 1 through stage 4 chronic kidney disease, or unspecified chronic kidney disease: Secondary | ICD-10-CM | POA: Diagnosis not present

## 2015-03-09 DIAGNOSIS — N189 Chronic kidney disease, unspecified: Secondary | ICD-10-CM | POA: Diagnosis not present

## 2015-03-09 DIAGNOSIS — I129 Hypertensive chronic kidney disease with stage 1 through stage 4 chronic kidney disease, or unspecified chronic kidney disease: Secondary | ICD-10-CM | POA: Diagnosis not present

## 2015-03-09 DIAGNOSIS — M6281 Muscle weakness (generalized): Secondary | ICD-10-CM | POA: Diagnosis not present

## 2015-03-09 DIAGNOSIS — R2681 Unsteadiness on feet: Secondary | ICD-10-CM | POA: Diagnosis not present

## 2015-03-13 DIAGNOSIS — M6281 Muscle weakness (generalized): Secondary | ICD-10-CM | POA: Diagnosis not present

## 2015-03-13 DIAGNOSIS — I129 Hypertensive chronic kidney disease with stage 1 through stage 4 chronic kidney disease, or unspecified chronic kidney disease: Secondary | ICD-10-CM | POA: Diagnosis not present

## 2015-03-13 DIAGNOSIS — R2681 Unsteadiness on feet: Secondary | ICD-10-CM | POA: Diagnosis not present

## 2015-03-13 DIAGNOSIS — N189 Chronic kidney disease, unspecified: Secondary | ICD-10-CM | POA: Diagnosis not present

## 2015-03-14 DIAGNOSIS — M6281 Muscle weakness (generalized): Secondary | ICD-10-CM | POA: Diagnosis not present

## 2015-03-14 DIAGNOSIS — J309 Allergic rhinitis, unspecified: Secondary | ICD-10-CM | POA: Diagnosis not present

## 2015-03-14 DIAGNOSIS — I10 Essential (primary) hypertension: Secondary | ICD-10-CM | POA: Diagnosis not present

## 2015-03-15 DIAGNOSIS — M6281 Muscle weakness (generalized): Secondary | ICD-10-CM | POA: Diagnosis not present

## 2015-03-15 DIAGNOSIS — I129 Hypertensive chronic kidney disease with stage 1 through stage 4 chronic kidney disease, or unspecified chronic kidney disease: Secondary | ICD-10-CM | POA: Diagnosis not present

## 2015-03-15 DIAGNOSIS — R2681 Unsteadiness on feet: Secondary | ICD-10-CM | POA: Diagnosis not present

## 2015-03-15 DIAGNOSIS — N189 Chronic kidney disease, unspecified: Secondary | ICD-10-CM | POA: Diagnosis not present

## 2015-03-20 ENCOUNTER — Emergency Department (HOSPITAL_COMMUNITY): Payer: Medicare Other

## 2015-03-20 ENCOUNTER — Encounter (HOSPITAL_COMMUNITY): Payer: Self-pay | Admitting: *Deleted

## 2015-03-20 ENCOUNTER — Emergency Department (HOSPITAL_COMMUNITY)
Admission: EM | Admit: 2015-03-20 | Discharge: 2015-03-20 | Disposition: A | Discharge status: Home / Self Care | Payer: Medicare Other | Attending: Physician Assistant | Admitting: Physician Assistant

## 2015-03-20 DIAGNOSIS — Z043 Encounter for examination and observation following other accident: Secondary | ICD-10-CM | POA: Diagnosis not present

## 2015-03-20 DIAGNOSIS — W19XXXA Unspecified fall, initial encounter: Secondary | ICD-10-CM

## 2015-03-20 DIAGNOSIS — Y9389 Activity, other specified: Secondary | ICD-10-CM | POA: Diagnosis not present

## 2015-03-20 DIAGNOSIS — F0390 Unspecified dementia without behavioral disturbance: Secondary | ICD-10-CM | POA: Diagnosis not present

## 2015-03-20 DIAGNOSIS — R011 Cardiac murmur, unspecified: Secondary | ICD-10-CM | POA: Insufficient documentation

## 2015-03-20 DIAGNOSIS — W010XXA Fall on same level from slipping, tripping and stumbling without subsequent striking against object, initial encounter: Secondary | ICD-10-CM | POA: Diagnosis not present

## 2015-03-20 DIAGNOSIS — Z792 Long term (current) use of antibiotics: Secondary | ICD-10-CM | POA: Diagnosis not present

## 2015-03-20 DIAGNOSIS — Y92002 Bathroom of unspecified non-institutional (private) residence single-family (private) house as the place of occurrence of the external cause: Secondary | ICD-10-CM | POA: Diagnosis not present

## 2015-03-20 DIAGNOSIS — I10 Essential (primary) hypertension: Secondary | ICD-10-CM | POA: Diagnosis not present

## 2015-03-20 DIAGNOSIS — S199XXA Unspecified injury of neck, initial encounter: Secondary | ICD-10-CM | POA: Diagnosis not present

## 2015-03-20 DIAGNOSIS — Y998 Other external cause status: Secondary | ICD-10-CM | POA: Diagnosis not present

## 2015-03-20 DIAGNOSIS — R51 Headache: Secondary | ICD-10-CM | POA: Diagnosis not present

## 2015-03-20 DIAGNOSIS — S0990XA Unspecified injury of head, initial encounter: Secondary | ICD-10-CM | POA: Diagnosis not present

## 2015-03-20 LAB — CBC WITH DIFFERENTIAL/PLATELET
BASOS ABS: 0 10*3/uL (ref 0.0–0.1)
Basophils Relative: 0 %
EOS PCT: 1 %
Eosinophils Absolute: 0.1 10*3/uL (ref 0.0–0.7)
HCT: 29.1 % — ABNORMAL LOW (ref 36.0–46.0)
HEMOGLOBIN: 9.9 g/dL — AB (ref 12.0–15.0)
LYMPHS PCT: 22 %
Lymphs Abs: 2 10*3/uL (ref 0.7–4.0)
MCH: 32.4 pg (ref 26.0–34.0)
MCHC: 34 g/dL (ref 30.0–36.0)
MCV: 95.1 fL (ref 78.0–100.0)
Monocytes Absolute: 0.3 10*3/uL (ref 0.1–1.0)
Monocytes Relative: 3 %
NEUTROS PCT: 74 %
Neutro Abs: 6.5 10*3/uL (ref 1.7–7.7)
PLATELETS: 205 10*3/uL (ref 150–400)
RBC: 3.06 MIL/uL — AB (ref 3.87–5.11)
RDW: 12.3 % (ref 11.5–15.5)
WBC: 8.8 10*3/uL (ref 4.0–10.5)

## 2015-03-20 LAB — COMPREHENSIVE METABOLIC PANEL
ALT: 10 U/L — ABNORMAL LOW (ref 14–54)
ANION GAP: 7 (ref 5–15)
AST: 21 U/L (ref 15–41)
Albumin: 3.9 g/dL (ref 3.5–5.0)
Alkaline Phosphatase: 52 U/L (ref 38–126)
BILIRUBIN TOTAL: 0.8 mg/dL (ref 0.3–1.2)
BUN: 12 mg/dL (ref 6–20)
CHLORIDE: 110 mmol/L (ref 101–111)
CO2: 26 mmol/L (ref 22–32)
Calcium: 9.7 mg/dL (ref 8.9–10.3)
Creatinine, Ser: 1.32 mg/dL — ABNORMAL HIGH (ref 0.44–1.00)
GFR, EST AFRICAN AMERICAN: 38 mL/min — AB (ref >60)
GFR, EST NON AFRICAN AMERICAN: 32 mL/min — AB (ref >60)
Glucose, Bld: 79 mg/dL (ref 65–99)
POTASSIUM: 4 mmol/L (ref 3.5–5.1)
Sodium: 143 mmol/L (ref 135–145)
TOTAL PROTEIN: 7.4 g/dL (ref 6.5–8.1)

## 2015-03-20 LAB — URINE MICROSCOPIC-ADD ON

## 2015-03-20 LAB — URINALYSIS, ROUTINE W REFLEX MICROSCOPIC
Bilirubin Urine: NEGATIVE
GLUCOSE, UA: NEGATIVE mg/dL
Hgb urine dipstick: NEGATIVE
Ketones, ur: NEGATIVE mg/dL
Nitrite: NEGATIVE
PROTEIN: NEGATIVE mg/dL
SPECIFIC GRAVITY, URINE: 1.011 (ref 1.005–1.030)
pH: 7.5 (ref 5.0–8.0)

## 2015-03-20 LAB — VALPROIC ACID LEVEL: VALPROIC ACID LVL: 29 ug/mL — AB (ref 50.0–100.0)

## 2015-03-20 NOTE — ED Notes (Signed)
Bed: ZO10 Expected date:  Expected time:  Means of arrival:  Comments: EMS- 82yo F, fall/multiple complaints/no thinners

## 2015-03-20 NOTE — ED Notes (Addendum)
Patient was ambulating on her unit and she sustained a witnessed fall. She fell on her back but it is unclear if she hit her head or not. Patient is not on any blood thinners. Patient does complain of headache. Patient does have a history of dementia and hallucinations. Patient is from [facility] assisted living.

## 2015-03-20 NOTE — ED Notes (Signed)
Patient ambulated to the bathroom with 2 person assist. 

## 2015-03-20 NOTE — ED Provider Notes (Signed)
CSN: [id number]     Arrival date & time 03/20/15  7829 History   First MD Initiated Contact with Patient 03/20/15 1019     Chief Complaint  Patient presents with  . Fall     (Consider location/radiation/quality/duration/timing/severity/associated sxs/prior Treatment) HPI    She is a 82 year old female with advanced dementia presenting after mechanical fall. Patient usually uses a walker. She was not using her walker and she was found after tripping. Patient's power of attorney is accompanied her today. This person requests a metabolic workup to make sure the patient does not have infection, including UA, including Depakote level. Patient is oriented only to self. Patient was ambulatory after fall.  Level V caveat dementia.  Past Medical History  Diagnosis Date  . Hypertension    History reviewed. No pertinent past surgical history. No family history on file. Social History  Substance Use Topics  . Smoking status: Never Smoker   . Smokeless tobacco: None  . Alcohol Use: No   OB History    No data available     Review of Systems  Unable to perform ROS: Dementia      Allergies  Review of patient's allergies indicates no known allergies.  Home Medications   Prior to Admission medications   Medication Sig Start Date End Date Taking? Authorizing Provider  AmLODIPine Besylate (NORVASC PO) Take by mouth.    Historical Provider, MD  cephALEXin (KEFLEX) 500 MG capsule Take 1 capsule (500 mg total) by mouth 3 (three) times daily. 10/27/12   Garlon Hatchet, PA-C   BP 145/41 mmHg  Pulse 71  Temp(Src) 98.1 F (36.7 C) (Oral)  Resp 18  SpO2 99% Physical Exam  Constitutional: She is oriented to person, place, and time. She appears well-developed and well-nourished.  Frail 82 year old female.  HENT:  Head: Normocephalic and atraumatic.  Eyes: Conjunctivae are normal. Right eye exhibits no discharge.  Neck: Neck supple.  Cardiovascular: Normal rate and regular rhythm.    Murmur heard. Pulmonary/Chest: Effort normal and breath sounds normal. She has no wheezes. She has no rales.  Abdominal: Soft. She exhibits no distension. There is no tenderness.  Musculoskeletal: Normal range of motion. She exhibits no edema.  Neurological: She is oriented to person, place, and time. No cranial nerve deficit.  Skin: Skin is warm and dry. No rash noted. She is not diaphoretic.  No evidence of trauma.  Psychiatric: She has a normal mood and affect. Her behavior is normal.  Nursing note and vitals reviewed.   ED Course  Procedures (including critical care time) Labs Review Labs Reviewed  COMPREHENSIVE METABOLIC PANEL - Abnormal; Notable for the following:    Creatinine, Ser 1.32 (*)    ALT 10 (*)    GFR calc non Af Amer 32 (*)    GFR calc Af Amer 38 (*)    All other components within normal limits  CBC WITH DIFFERENTIAL/PLATELET - Abnormal; Notable for the following:    RBC 3.06 (*)    Hemoglobin 9.9 (*)    HCT 29.1 (*)    All other components within normal limits  URINALYSIS, ROUTINE W REFLEX MICROSCOPIC (NOT AT [facility]) - Abnormal; Notable for the following:    Leukocytes, UA SMALL (*)    All other components within normal limits  VALPROIC ACID LEVEL - Abnormal; Notable for the following:    Valproic Acid Lvl 29 (*)    All other components within normal limits  URINE MICROSCOPIC-ADD ON - Abnormal; Notable for the following:  Squamous Epithelial / LPF 0-5 (*)    Bacteria, UA FEW (*)    All other components within normal limits  URINE CULTURE    Imaging Review Ct Head Wo Contrast  03/20/2015  CLINICAL DATA:  Dementia, fall, no head or neck complaints EXAM: CT HEAD WITHOUT CONTRAST CT CERVICAL SPINE WITHOUT CONTRAST TECHNIQUE: Multidetector CT imaging of the head and cervical spine was performed following the standard protocol without intravenous contrast. Multiplanar CT image reconstructions of the cervical spine were also generated. COMPARISON:  01/09/2014  FINDINGS: CT HEAD FINDINGS There is no evidence of mass effect, midline shift, or extra-axial fluid collections. There is no evidence of a space-occupying lesion or intracranial hemorrhage. There is no evidence of a cortical-based area of acute infarction. There is generalized cerebral atrophy. There is periventricular white matter low attenuation likely secondary to microangiopathy. The ventricles and sulci are appropriate for the patient's age. The basal cisterns are patent. Visualized portions of the orbits are unremarkable. The visualized portions of the paranasal sinuses and mastoid air cells are unremarkable. Cerebrovascular atherosclerotic calcifications are noted. The osseous structures are unremarkable. CT CERVICAL SPINE FINDINGS The alignment is anatomic. There is mild compression deformity of the T2 vertebral body. There are erosive changes of the dens with surrounding pannus formation and erosions of the lateral masses of C1. The prevertebral soft tissues are normal. The intraspinal soft tissues are not fully imaged on this examination due to poor soft tissue contrast, but there is no gross soft tissue abnormality. There is degenerative disc disease throughout the cervical spine. There is 3 mm of anterolisthesis of C3 on C4 secondary to bilateral facet arthropathy. Bilateral facet arthropathy at C2-3. At C3-4 there is a broad-based disc osteophyte complex with bilateral uncovertebral degenerative changes and bilateral facet arthropathy resulting in bilateral foraminal narrowing. At C4-5 there are bilateral uncovertebral degenerative changes and mild facet arthropathy resulting in bilateral mild foraminal narrowing. There is ankylosis of the posterior elements of C5-6. There is a broad-based disc osteophyte complex at C6-7. There is a 4 mm left apical pulmonary nodule (image 57/ series 6). There is bilateral carotid artery atherosclerosis. 6 mm right apical pulmonary nodule which is stable in size and  sided less conspicuous compared with the prior exam. IMPRESSION: 1. No acute intracranial pathology. 2. There is mild anterior compression deformity of the T2 vertebral body which is of indeterminate age. This was only partially included in the field of view on the prior exam. 3. Otherwise no acute osseous injury of the cervical spine. 4. 4 mm left apical pulmonary nodule. If the patient is at high risk for bronchogenic carcinoma, follow-up chest CT at 1year is recommended. If the patient is at low risk, no follow-up is needed. This recommendation follows the consensus statement: Guidelines for Management of Small Pulmonary Nodules Detected on CT Scans: A Statement from the Fleischner Society as published in Radiology 2005; 237:395-400. Electronically Signed   By: Elige Ko   On: 03/20/2015 12:28   Ct Cervical Spine Wo Contrast  03/20/2015  CLINICAL DATA:  Dementia, fall, no head or neck complaints EXAM: CT HEAD WITHOUT CONTRAST CT CERVICAL SPINE WITHOUT CONTRAST TECHNIQUE: Multidetector CT imaging of the head and cervical spine was performed following the standard protocol without intravenous contrast. Multiplanar CT image reconstructions of the cervical spine were also generated. COMPARISON:  01/09/2014 FINDINGS: CT HEAD FINDINGS There is no evidence of mass effect, midline shift, or extra-axial fluid collections. There is no evidence of a space-occupying lesion or  intracranial hemorrhage. There is no evidence of a cortical-based area of acute infarction. There is generalized cerebral atrophy. There is periventricular white matter low attenuation likely secondary to microangiopathy. The ventricles and sulci are appropriate for the patient's age. The basal cisterns are patent. Visualized portions of the orbits are unremarkable. The visualized portions of the paranasal sinuses and mastoid air cells are unremarkable. Cerebrovascular atherosclerotic calcifications are noted. The osseous structures are  unremarkable. CT CERVICAL SPINE FINDINGS The alignment is anatomic. There is mild compression deformity of the T2 vertebral body. There are erosive changes of the dens with surrounding pannus formation and erosions of the lateral masses of C1. The prevertebral soft tissues are normal. The intraspinal soft tissues are not fully imaged on this examination due to poor soft tissue contrast, but there is no gross soft tissue abnormality. There is degenerative disc disease throughout the cervical spine. There is 3 mm of anterolisthesis of C3 on C4 secondary to bilateral facet arthropathy. Bilateral facet arthropathy at C2-3. At C3-4 there is a broad-based disc osteophyte complex with bilateral uncovertebral degenerative changes and bilateral facet arthropathy resulting in bilateral foraminal narrowing. At C4-5 there are bilateral uncovertebral degenerative changes and mild facet arthropathy resulting in bilateral mild foraminal narrowing. There is ankylosis of the posterior elements of C5-6. There is a broad-based disc osteophyte complex at C6-7. There is a 4 mm left apical pulmonary nodule (image 57/ series 6). There is bilateral carotid artery atherosclerosis. 6 mm right apical pulmonary nodule which is stable in size and sided less conspicuous compared with the prior exam. IMPRESSION: 1. No acute intracranial pathology. 2. There is mild anterior compression deformity of the T2 vertebral body which is of indeterminate age. This was only partially included in the field of view on the prior exam. 3. Otherwise no acute osseous injury of the cervical spine. 4. 4 mm left apical pulmonary nodule. If the patient is at high risk for bronchogenic carcinoma, follow-up chest CT at 1year is recommended. If the patient is at low risk, no follow-up is needed. This recommendation follows the consensus statement: Guidelines for Management of Small Pulmonary Nodules Detected on CT Scans: A Statement from the Fleischner Society as  published in Radiology 2005; 237:395-400. Electronically Signed   By: Elige Ko   On: 03/20/2015 12:28   I have personally reviewed and evaluated these images and lab results as part of my medical decision-making.   EKG Interpretation None      MDM   Final diagnoses:  Fall, initial encounter    Patient is a 82 year old female with advanced dementia presenting after mechanical fall. Patient's power of attorney would like to have a Depakote level, labs, UA. We will facilitate this at this time. We will get CT head and neck given her age and the fall. However there is no external signs of trauma at all. No need for Tdap at this time. She moving all 4 extremities and in no acute distress.   CT shows no acute pathology associated with fall. Patietn at baseline. Family aware that dilantin level low.   The patient appears reasonably screen and/or stabilized for discharge and I doubt any other medical condition that requires further screening, evaluation, or treatment in the ED at this time prior to discharge.      Shamya Macfadden Randall An, MD 03/20/15 1557

## 2015-03-20 NOTE — ED Notes (Signed)
Patient in Restroom

## 2015-03-21 LAB — URINE CULTURE

## 2015-04-03 DIAGNOSIS — I358 Other nonrheumatic aortic valve disorders: Secondary | ICD-10-CM | POA: Diagnosis not present

## 2015-04-03 DIAGNOSIS — N189 Chronic kidney disease, unspecified: Secondary | ICD-10-CM | POA: Diagnosis not present

## 2015-04-03 DIAGNOSIS — E782 Mixed hyperlipidemia: Secondary | ICD-10-CM | POA: Diagnosis not present

## 2015-04-03 DIAGNOSIS — I4891 Unspecified atrial fibrillation: Secondary | ICD-10-CM | POA: Diagnosis not present

## 2015-04-11 DIAGNOSIS — R2689 Other abnormalities of gait and mobility: Secondary | ICD-10-CM | POA: Diagnosis not present

## 2015-04-11 DIAGNOSIS — I10 Essential (primary) hypertension: Secondary | ICD-10-CM | POA: Diagnosis not present

## 2015-04-11 DIAGNOSIS — J309 Allergic rhinitis, unspecified: Secondary | ICD-10-CM | POA: Diagnosis not present

## 2015-04-11 DIAGNOSIS — M6281 Muscle weakness (generalized): Secondary | ICD-10-CM | POA: Diagnosis not present

## 2015-05-01 DIAGNOSIS — E782 Mixed hyperlipidemia: Secondary | ICD-10-CM | POA: Diagnosis not present

## 2015-05-01 DIAGNOSIS — M6281 Muscle weakness (generalized): Secondary | ICD-10-CM | POA: Diagnosis not present

## 2015-05-01 DIAGNOSIS — I4891 Unspecified atrial fibrillation: Secondary | ICD-10-CM | POA: Diagnosis not present

## 2015-05-01 DIAGNOSIS — I358 Other nonrheumatic aortic valve disorders: Secondary | ICD-10-CM | POA: Diagnosis not present

## 2015-05-01 DIAGNOSIS — N189 Chronic kidney disease, unspecified: Secondary | ICD-10-CM | POA: Diagnosis not present

## 2015-05-09 DIAGNOSIS — I10 Essential (primary) hypertension: Secondary | ICD-10-CM | POA: Diagnosis not present

## 2015-05-09 DIAGNOSIS — M6281 Muscle weakness (generalized): Secondary | ICD-10-CM | POA: Diagnosis not present

## 2015-05-09 DIAGNOSIS — R2689 Other abnormalities of gait and mobility: Secondary | ICD-10-CM | POA: Diagnosis not present

## 2015-05-09 DIAGNOSIS — J309 Allergic rhinitis, unspecified: Secondary | ICD-10-CM | POA: Diagnosis not present

## 2015-05-09 DIAGNOSIS — R05 Cough: Secondary | ICD-10-CM | POA: Diagnosis not present

## 2015-05-31 DIAGNOSIS — E782 Mixed hyperlipidemia: Secondary | ICD-10-CM | POA: Diagnosis not present

## 2015-05-31 DIAGNOSIS — I4891 Unspecified atrial fibrillation: Secondary | ICD-10-CM | POA: Diagnosis not present

## 2015-05-31 DIAGNOSIS — I358 Other nonrheumatic aortic valve disorders: Secondary | ICD-10-CM | POA: Diagnosis not present

## 2015-05-31 DIAGNOSIS — N189 Chronic kidney disease, unspecified: Secondary | ICD-10-CM | POA: Diagnosis not present

## 2015-05-31 DIAGNOSIS — D649 Anemia, unspecified: Secondary | ICD-10-CM | POA: Diagnosis not present

## 2015-06-06 DIAGNOSIS — I10 Essential (primary) hypertension: Secondary | ICD-10-CM | POA: Diagnosis not present

## 2015-06-06 DIAGNOSIS — R2689 Other abnormalities of gait and mobility: Secondary | ICD-10-CM | POA: Diagnosis not present

## 2015-06-06 DIAGNOSIS — M6281 Muscle weakness (generalized): Secondary | ICD-10-CM | POA: Diagnosis not present

## 2015-06-06 DIAGNOSIS — J309 Allergic rhinitis, unspecified: Secondary | ICD-10-CM | POA: Diagnosis not present

## 2015-06-13 DIAGNOSIS — E782 Mixed hyperlipidemia: Secondary | ICD-10-CM | POA: Diagnosis not present

## 2015-06-13 DIAGNOSIS — I10 Essential (primary) hypertension: Secondary | ICD-10-CM | POA: Diagnosis not present

## 2015-06-13 DIAGNOSIS — E039 Hypothyroidism, unspecified: Secondary | ICD-10-CM | POA: Diagnosis not present

## 2015-06-21 DIAGNOSIS — B351 Tinea unguium: Secondary | ICD-10-CM | POA: Diagnosis not present

## 2015-06-27 DIAGNOSIS — D649 Anemia, unspecified: Secondary | ICD-10-CM | POA: Diagnosis not present

## 2015-06-27 DIAGNOSIS — N183 Chronic kidney disease, stage 3 (moderate): Secondary | ICD-10-CM | POA: Diagnosis not present

## 2015-06-30 DIAGNOSIS — I4891 Unspecified atrial fibrillation: Secondary | ICD-10-CM | POA: Diagnosis not present

## 2015-06-30 DIAGNOSIS — D649 Anemia, unspecified: Secondary | ICD-10-CM | POA: Diagnosis not present

## 2015-06-30 DIAGNOSIS — I358 Other nonrheumatic aortic valve disorders: Secondary | ICD-10-CM | POA: Diagnosis not present

## 2015-06-30 DIAGNOSIS — E782 Mixed hyperlipidemia: Secondary | ICD-10-CM | POA: Diagnosis not present

## 2015-06-30 DIAGNOSIS — N189 Chronic kidney disease, unspecified: Secondary | ICD-10-CM | POA: Diagnosis not present

## 2015-07-04 DIAGNOSIS — M6281 Muscle weakness (generalized): Secondary | ICD-10-CM | POA: Diagnosis not present

## 2015-07-04 DIAGNOSIS — R2689 Other abnormalities of gait and mobility: Secondary | ICD-10-CM | POA: Diagnosis not present

## 2015-07-04 DIAGNOSIS — I10 Essential (primary) hypertension: Secondary | ICD-10-CM | POA: Diagnosis not present

## 2015-07-04 DIAGNOSIS — J309 Allergic rhinitis, unspecified: Secondary | ICD-10-CM | POA: Diagnosis not present

## 2015-07-04 DIAGNOSIS — D649 Anemia, unspecified: Secondary | ICD-10-CM | POA: Diagnosis not present

## 2015-08-01 DIAGNOSIS — I10 Essential (primary) hypertension: Secondary | ICD-10-CM | POA: Diagnosis not present

## 2015-08-01 DIAGNOSIS — J309 Allergic rhinitis, unspecified: Secondary | ICD-10-CM | POA: Diagnosis not present

## 2015-08-01 DIAGNOSIS — R2689 Other abnormalities of gait and mobility: Secondary | ICD-10-CM | POA: Diagnosis not present

## 2015-08-01 DIAGNOSIS — D649 Anemia, unspecified: Secondary | ICD-10-CM | POA: Diagnosis not present

## 2015-08-01 DIAGNOSIS — M6281 Muscle weakness (generalized): Secondary | ICD-10-CM | POA: Diagnosis not present

## 2015-08-29 DIAGNOSIS — R2689 Other abnormalities of gait and mobility: Secondary | ICD-10-CM | POA: Diagnosis not present

## 2015-08-29 DIAGNOSIS — M6281 Muscle weakness (generalized): Secondary | ICD-10-CM | POA: Diagnosis not present

## 2015-08-29 DIAGNOSIS — I10 Essential (primary) hypertension: Secondary | ICD-10-CM | POA: Diagnosis not present

## 2015-08-29 DIAGNOSIS — D649 Anemia, unspecified: Secondary | ICD-10-CM | POA: Diagnosis not present

## 2015-08-29 DIAGNOSIS — J309 Allergic rhinitis, unspecified: Secondary | ICD-10-CM | POA: Diagnosis not present

## 2015-08-30 DIAGNOSIS — I4891 Unspecified atrial fibrillation: Secondary | ICD-10-CM | POA: Diagnosis not present

## 2015-08-30 DIAGNOSIS — D649 Anemia, unspecified: Secondary | ICD-10-CM | POA: Diagnosis not present

## 2015-08-30 DIAGNOSIS — I358 Other nonrheumatic aortic valve disorders: Secondary | ICD-10-CM | POA: Diagnosis not present

## 2015-08-30 DIAGNOSIS — E782 Mixed hyperlipidemia: Secondary | ICD-10-CM | POA: Diagnosis not present

## 2015-09-19 DIAGNOSIS — H1045 Other chronic allergic conjunctivitis: Secondary | ICD-10-CM | POA: Diagnosis not present

## 2015-09-26 DIAGNOSIS — J309 Allergic rhinitis, unspecified: Secondary | ICD-10-CM | POA: Diagnosis not present

## 2015-09-26 DIAGNOSIS — M791 Myalgia: Secondary | ICD-10-CM | POA: Diagnosis not present

## 2015-09-26 DIAGNOSIS — D649 Anemia, unspecified: Secondary | ICD-10-CM | POA: Diagnosis not present

## 2015-09-26 DIAGNOSIS — Z79899 Other long term (current) drug therapy: Secondary | ICD-10-CM | POA: Diagnosis not present

## 2015-09-26 DIAGNOSIS — R2689 Other abnormalities of gait and mobility: Secondary | ICD-10-CM | POA: Diagnosis not present

## 2015-09-26 DIAGNOSIS — I10 Essential (primary) hypertension: Secondary | ICD-10-CM | POA: Diagnosis not present

## 2015-09-26 DIAGNOSIS — M6281 Muscle weakness (generalized): Secondary | ICD-10-CM | POA: Diagnosis not present

## 2015-09-27 DIAGNOSIS — F0390 Unspecified dementia without behavioral disturbance: Secondary | ICD-10-CM | POA: Diagnosis not present

## 2015-10-01 DIAGNOSIS — E782 Mixed hyperlipidemia: Secondary | ICD-10-CM | POA: Diagnosis not present

## 2015-10-01 DIAGNOSIS — D649 Anemia, unspecified: Secondary | ICD-10-CM | POA: Diagnosis not present

## 2015-10-01 DIAGNOSIS — I358 Other nonrheumatic aortic valve disorders: Secondary | ICD-10-CM | POA: Diagnosis not present

## 2015-10-01 DIAGNOSIS — I4891 Unspecified atrial fibrillation: Secondary | ICD-10-CM | POA: Diagnosis not present

## 2015-10-10 DIAGNOSIS — Z79899 Other long term (current) drug therapy: Secondary | ICD-10-CM | POA: Diagnosis not present

## 2015-10-24 DIAGNOSIS — R2689 Other abnormalities of gait and mobility: Secondary | ICD-10-CM | POA: Diagnosis not present

## 2015-10-24 DIAGNOSIS — I358 Other nonrheumatic aortic valve disorders: Secondary | ICD-10-CM | POA: Diagnosis not present

## 2015-10-24 DIAGNOSIS — M6281 Muscle weakness (generalized): Secondary | ICD-10-CM | POA: Diagnosis not present

## 2015-10-24 DIAGNOSIS — J309 Allergic rhinitis, unspecified: Secondary | ICD-10-CM | POA: Diagnosis not present

## 2015-10-24 DIAGNOSIS — I10 Essential (primary) hypertension: Secondary | ICD-10-CM | POA: Diagnosis not present

## 2015-10-30 DIAGNOSIS — I358 Other nonrheumatic aortic valve disorders: Secondary | ICD-10-CM | POA: Diagnosis not present

## 2015-10-30 DIAGNOSIS — E782 Mixed hyperlipidemia: Secondary | ICD-10-CM | POA: Diagnosis not present

## 2015-10-30 DIAGNOSIS — D649 Anemia, unspecified: Secondary | ICD-10-CM | POA: Diagnosis not present

## 2015-10-30 DIAGNOSIS — I4891 Unspecified atrial fibrillation: Secondary | ICD-10-CM | POA: Diagnosis not present

## 2015-11-21 DIAGNOSIS — I358 Other nonrheumatic aortic valve disorders: Secondary | ICD-10-CM | POA: Diagnosis not present

## 2015-11-21 DIAGNOSIS — M6281 Muscle weakness (generalized): Secondary | ICD-10-CM | POA: Diagnosis not present

## 2015-11-21 DIAGNOSIS — R2689 Other abnormalities of gait and mobility: Secondary | ICD-10-CM | POA: Diagnosis not present

## 2015-11-21 DIAGNOSIS — I10 Essential (primary) hypertension: Secondary | ICD-10-CM | POA: Diagnosis not present

## 2015-11-21 DIAGNOSIS — J309 Allergic rhinitis, unspecified: Secondary | ICD-10-CM | POA: Diagnosis not present

## 2015-11-22 DIAGNOSIS — B353 Tinea pedis: Secondary | ICD-10-CM | POA: Diagnosis not present

## 2015-11-28 DIAGNOSIS — I358 Other nonrheumatic aortic valve disorders: Secondary | ICD-10-CM | POA: Diagnosis not present

## 2015-11-28 DIAGNOSIS — I4891 Unspecified atrial fibrillation: Secondary | ICD-10-CM | POA: Diagnosis not present

## 2015-11-28 DIAGNOSIS — D649 Anemia, unspecified: Secondary | ICD-10-CM | POA: Diagnosis not present

## 2015-11-28 DIAGNOSIS — E782 Mixed hyperlipidemia: Secondary | ICD-10-CM | POA: Diagnosis not present

## 2015-12-19 DIAGNOSIS — R2689 Other abnormalities of gait and mobility: Secondary | ICD-10-CM | POA: Diagnosis not present

## 2015-12-19 DIAGNOSIS — J309 Allergic rhinitis, unspecified: Secondary | ICD-10-CM | POA: Diagnosis not present

## 2015-12-19 DIAGNOSIS — I10 Essential (primary) hypertension: Secondary | ICD-10-CM | POA: Diagnosis not present

## 2015-12-19 DIAGNOSIS — M6281 Muscle weakness (generalized): Secondary | ICD-10-CM | POA: Diagnosis not present

## 2015-12-19 DIAGNOSIS — M791 Myalgia: Secondary | ICD-10-CM | POA: Diagnosis not present

## 2015-12-28 ENCOUNTER — Encounter (HOSPITAL_BASED_OUTPATIENT_CLINIC_OR_DEPARTMENT_OTHER): Payer: Self-pay

## 2015-12-28 ENCOUNTER — Emergency Department (HOSPITAL_BASED_OUTPATIENT_CLINIC_OR_DEPARTMENT_OTHER)
Admission: EM | Admit: 2015-12-28 | Discharge: 2015-12-28 | Disposition: A | Discharge status: Home / Self Care | Payer: Medicare Other | Attending: Emergency Medicine | Admitting: Emergency Medicine

## 2015-12-28 ENCOUNTER — Emergency Department (HOSPITAL_BASED_OUTPATIENT_CLINIC_OR_DEPARTMENT_OTHER): Payer: Medicare Other

## 2015-12-28 DIAGNOSIS — M4856XA Collapsed vertebra, not elsewhere classified, lumbar region, initial encounter for fracture: Secondary | ICD-10-CM | POA: Insufficient documentation

## 2015-12-28 DIAGNOSIS — Z79899 Other long term (current) drug therapy: Secondary | ICD-10-CM | POA: Insufficient documentation

## 2015-12-28 DIAGNOSIS — M4854XA Collapsed vertebra, not elsewhere classified, thoracic region, initial encounter for fracture: Secondary | ICD-10-CM | POA: Diagnosis not present

## 2015-12-28 DIAGNOSIS — I129 Hypertensive chronic kidney disease with stage 1 through stage 4 chronic kidney disease, or unspecified chronic kidney disease: Secondary | ICD-10-CM | POA: Insufficient documentation

## 2015-12-28 DIAGNOSIS — M549 Dorsalgia, unspecified: Secondary | ICD-10-CM | POA: Diagnosis present

## 2015-12-28 DIAGNOSIS — S22080A Wedge compression fracture of T11-T12 vertebra, initial encounter for closed fracture: Secondary | ICD-10-CM | POA: Diagnosis not present

## 2015-12-28 DIAGNOSIS — N189 Chronic kidney disease, unspecified: Secondary | ICD-10-CM | POA: Insufficient documentation

## 2015-12-28 DIAGNOSIS — S22000A Wedge compression fracture of unspecified thoracic vertebra, initial encounter for closed fracture: Secondary | ICD-10-CM

## 2015-12-28 DIAGNOSIS — S32000A Wedge compression fracture of unspecified lumbar vertebra, initial encounter for closed fracture: Secondary | ICD-10-CM

## 2015-12-28 DIAGNOSIS — S32010A Wedge compression fracture of first lumbar vertebra, initial encounter for closed fracture: Secondary | ICD-10-CM | POA: Diagnosis not present

## 2015-12-28 DIAGNOSIS — M545 Low back pain: Secondary | ICD-10-CM | POA: Diagnosis not present

## 2015-12-28 DIAGNOSIS — S32050A Wedge compression fracture of fifth lumbar vertebra, initial encounter for closed fracture: Secondary | ICD-10-CM | POA: Diagnosis not present

## 2015-12-28 MED ORDER — HYDROCODONE-ACETAMINOPHEN 5-325 MG PO TABS: 1.0000 | ORAL_TABLET | Freq: Four times a day (QID) | ORAL | 0 refills | Status: DC | PRN

## 2015-12-28 NOTE — ED Triage Notes (Signed)
Pt c/o mid back pain x 1 month-no known injury-caretaker brought pt in today [facility] told her pt is not getting up and continues to c/o of back pain-pt in w/c-NAD

## 2015-12-28 NOTE — Discharge Instructions (Signed)
You may take 1000mg  tylenol (2 extra strength tylenol) three times daily as needed for pain. If the pain is severe, you may take 1 norco INSTEAD of tylenol. Do not take tylenol with the Norco. Be careful when taking Norco as it can make you sleepy or off-balance. Follow up with your primary care provider in 1 week.

## 2015-12-28 NOTE — ED Provider Notes (Signed)
[facility] Provider Note   CSN: [id number] Arrival date & time: 12/28/15  1823 By signing my name below, I, Levon HedgerElizabeth Hall, attest that this documentation has been prepared under the direction and in the presence of Laurence Spatesachel Morgan Angeles Zehner, MD . Electronically Signed: Levon HedgerElizabeth Hall, Scribe. 12/28/2015. 8:30 PM.   History   Chief Complaint Chief Complaint  Patient presents with  . Back Pain   HPI Crystal Rowland is a 82 y.o. female who presents to the Emergency Department complaining of progressively worsening upper back pain with radiation to her mid and lower back onset ~ one month ago. Pain is exacerbated by walking or standing up; pt states the pain begins to decrease after walking for a while. She is taking 1 tylenol in the morning and 1 tylenol at night with no relief. Pt has been staying in bed due to pain recently. Caregiver notes some weight loss over the past 1 year due to decreased appetite. Pt fell one year ago, but has not fallen recently. She is working with a therapist occasionally at her house; her caretaker states that she had x-rays done a few months ago, but she is unsure of the results. Pt denies any fever, night sweats, cough, abdominal pain, numbness or weakness to arms or legs, no urinary sx or incontinence.  The history is provided by the patient. No language interpreter was used.   Past Medical History:  Diagnosis Date  . Hypertension     Patient Active Problem List   Diagnosis Date Noted  . Hyperlipidemia 06/24/2011  . Chronic renal insufficiency 06/24/2011  . Aortic valve stenosis 06/24/2011  . Atrial fibrillation (HCC) 04/11/2011  . Anemia 04/11/2011    History reviewed. No pertinent surgical history.  OB History    No data available     Home Medications    Prior to Admission medications   Medication Sig Start Date End Date Taking? Authorizing Provider  Acetaminophen (TYLENOL PO) Take by mouth.   Yes Historical Provider, MD    clindamycin (CLEOCIN) 300 MG capsule Take 300 mg by mouth 3 (three) times daily.   Yes Historical Provider, MD  Divalproex Sodium (DEPAKOTE PO) Take by mouth.   Yes Historical Provider, MD  donepezil (ARICEPT) 5 MG tablet Take 5 mg by mouth at bedtime.   Yes Historical Provider, MD  LORazepam (ATIVAN) 0.5 MG tablet 0.5 mg at bedtime.   Yes Historical Provider, MD  Multiple Vitamins-Iron (MULTIVITAMINS WITH IRON) TABS tablet Take 1 tablet by mouth daily.   Yes Historical Provider, MD  polyethylene glycol (MIRALAX / GLYCOLAX) packet Take 17 g by mouth daily.   Yes Historical Provider, MD  QUEtiapine (SEROQUEL) 50 MG tablet Take 50 mg by mouth at bedtime.   Yes Historical Provider, MD  AmLODIPine Besylate (NORVASC PO) Take by mouth.    Historical Provider, MD  HYDROcodone-acetaminophen (NORCO/VICODIN) 5-325 MG tablet Take 1-2 tablets by mouth every 6 (six) hours as needed for severe pain. 12/28/15   Laurence Spatesachel Morgan Harlow Basley, MD    Family History No family history on file.  Social History Social History  Substance Use Topics  . Smoking status: Never Smoker  . Smokeless tobacco: Never Used  . Alcohol use No    Allergies   Review of patient's allergies indicates no known allergies.  Review of Systems Review of Systems 10 systems reviewed and all are negative for acute change except as noted in the HPI.   Physical Exam Updated Vital Signs BP 114/57 (BP Location: Right  Arm)   Pulse 71   Temp 98.1 F (36.7 C) (Oral)   Resp 16   Ht 5\' 2"  (1.575 m)   Wt 110 lb (49.9 kg)   SpO2 98%   BMI 20.12 kg/m   Physical Exam  Constitutional: She is oriented to person, place, and time. No distress.  Frail, elderly woman awake, alert, NAD  HENT:  Head: Normocephalic and atraumatic.  Eyes: Conjunctivae are normal.  Neck: Neck supple.  Cardiovascular: Normal rate and regular rhythm.   Murmur heard.  Systolic murmur is present with a grade of 4/6  Pulmonary/Chest: Effort normal and breath  sounds normal.  Abdominal: Soft. Bowel sounds are normal. She exhibits no distension. There is no tenderness.  Musculoskeletal: She exhibits no edema.  Mild tenderness of mid-thoracic spine without stepoff or deformity  Neurological: She is alert and oriented to person, place, and time.  Fluent speech 5/5 strength and normal sensation x all 4 ext  Skin: Skin is warm and dry. No rash noted. No erythema.  Psychiatric: She has a normal mood and affect. Judgment normal.  Nursing note and vitals reviewed.    ED Treatments / Results  DIAGNOSTIC STUDIES:  Oxygen Saturation is 98% on RA, normal by my interpretation.    COORDINATION OF CARE:  6:50 PM Will order DG thoracic spine and DG lumbar spine. Discussed treatment plan with pt at bedside and pt agreed to plan.  Labs (all labs ordered are listed, but only abnormal results are displayed) Labs Reviewed - No data to display  EKG  EKG Interpretation None       Radiology Dg Thoracic Spine 2 View  Result Date: 12/28/2015 CLINICAL DATA:  Thoracic spine pain for several months without known injury. EXAM: THORACIC SPINE 2 VIEWS COMPARISON:  Radiographs of April 11, 2011. FINDINGS: New moderate wedge compression deformity of T11 vertebral body is noted consistent with fracture of indeterminate age. No spondylolisthesis is noted. Disc spaces are well-maintained. IMPRESSION: Moderate wedge compression deformity of T11 vertebral body consistent with fracture of indeterminate age. MRI or CT scan is recommended for further evaluation. Electronically Signed   By: Lupita Raider, M.D.   On: 12/28/2015 19:24   Dg Lumbar Spine Complete  Result Date: 12/28/2015 CLINICAL DATA:  Thoracic spine pain for several months without injury. EXAM: LUMBAR SPINE - COMPLETE 4+ VIEW COMPARISON:  None. FINDINGS: Grade 2 anterolisthesis of L4-5 is noted. Atherosclerosis of abdominal aorta is noted. Compression deformity of L5 vertebral body is noted of  indeterminate age. Mild compression deformity of L1 vertebral body is also noted consistent with fracture of indeterminate age. IMPRESSION: Compression deformities of L1 and L5 vertebral bodies are noted consistent with fractures of indeterminate age. Grade 2 anterolisthesis of L4-5 is noted of unknown etiology. Further evaluation with CT scan or MRI of lumbar spine is recommended. Electronically Signed   By: Lupita Raider, M.D.   On: 12/28/2015 19:27   Ct Thoracic Spine Wo Contrast  Result Date: 12/28/2015 CLINICAL DATA:  T11 vertebral body fractures seen on radiograph EXAM: CT THORACIC SPINE WITHOUT CONTRAST TECHNIQUE: Multidetector CT imaging of the thoracic spine was performed without intravenous contrast administration. Multiplanar CT image reconstructions were also generated. COMPARISON:  Chest radiograph 12/28/2015 and 04/11/2011 FINDINGS: Alignment: Alignment is normal. There is mildly increased thoracic kyphosis. Vertebrae: There is a compression fracture of the T11 vertebral body with approximately 50% height loss. No significant retropulsion. There is no surrounding hematoma or other soft tissue abnormality. There are prominent  Schmorl's nodes at the inferior T12 endplate, superior L1 endplate and inferior L2 endplate. There is generalized mild vertebral body height loss of the mid thoracic spine. Paraspinal and other soft tissues: There is atherosclerotic calcification of the abdominal aorta and coronary arteries. The visualized intraperitoneal structures are unremarkable. There is a 3 mm nodule in the left upper lobe. Disc levels: There is no bony spinal canal stenosis. No large disc herniation is identified. IMPRESSION: 1. Compression fracture of the T11 vertebral body with approximately 50% height loss. No significant retropulsion or bony spinal canal stenosis. No surrounding hematoma or soft tissue abnormality, which argues against the fracture being acute. The fracture remains age  indeterminate, but new compared to the chest radiograph of 04/11/2011. MRI to evaluate for marrow edema could be helpful, if there are no contraindications. 2. Generalized multilevel vertebral body height loss of the thoracic spine. 3. 3 mm left upper lobe pulmonary nodule. No follow-up needed if patient is low-risk. Non-contrast chest CT can be considered in 12 months if patient is high-risk. This recommendation follows the consensus statement: Guidelines for Management of Incidental Pulmonary Nodules Detected on CT Images: From the Fleischner Society 2017; Radiology 2017; 284:228-243. Electronically Signed   By: Deatra Robinson M.D.   On: 12/28/2015 20:07    Procedures Procedures (including critical care time)  Medications Ordered in ED Medications - No data to display   Initial Impression / Assessment and Plan / ED Course  I have reviewed the triage vital signs and the nursing notes.  Pertinent imaging results that were available during my care of the patient were reviewed by me and considered in my medical decision making (see chart for details).  Clinical Course   PT w/ atraumatic thoracic back pain radiating into lower back occasionally, worse w/ movement, for 1-2 months. No infectious sx. Patient was comfortable on exam with normal vital signs. Neurologically intact. Normal strength and sensation of lower extremities. No signs/symptoms to suggest cauda equina. Obtained plain films of thoracic and lumbar spine which showed age indeterminant T 11 vertebral body fracture. Patient also has a few age-indeterminate lumbar compression fractures but since pain primarily in thoracic spine Obtained thoracic CT for better evaluation. CT shows T11 compression fracture with 50% loss of height, no retropulsion. The patient is neurologically intact with no evidence of cauda equina and no other complaints. Given the chronicity of her pain and no other complaints, I feel she is safe for discharge. I have  discussed pain control including scheduled Tylenol and I provided with small amount of Norco to use with caution as needed instead of Tylenol or severe pain. Instructed to follow-up with PCP. Caregiver and patient voiced understanding of plan and return precautions and patient was discharged in satisfactory condition.    Final Clinical Impressions(s) / ED Diagnoses   Final diagnoses:  Thoracic compression fracture, closed, initial encounter (HCC)  Lumbar compression fracture, closed, initial encounter (HCC)    New Prescriptions New Prescriptions   HYDROCODONE-ACETAMINOPHEN (NORCO/VICODIN) 5-325 MG TABLET    Take 1-2 tablets by mouth every 6 (six) hours as needed for severe pain.  I personally performed the services described in this documentation, which was scribed in my presence. The recorded information has been reviewed and is accurate.    Laurence Spates, MD 12/28/15 2031

## 2015-12-28 NOTE — ED Notes (Signed)
MD at bedside. 

## 2015-12-30 DIAGNOSIS — I4891 Unspecified atrial fibrillation: Secondary | ICD-10-CM | POA: Diagnosis not present

## 2015-12-30 DIAGNOSIS — E782 Mixed hyperlipidemia: Secondary | ICD-10-CM | POA: Diagnosis not present

## 2015-12-30 DIAGNOSIS — D649 Anemia, unspecified: Secondary | ICD-10-CM | POA: Diagnosis not present

## 2015-12-30 DIAGNOSIS — I358 Other nonrheumatic aortic valve disorders: Secondary | ICD-10-CM | POA: Diagnosis not present

## 2016-01-16 DIAGNOSIS — Z79899 Other long term (current) drug therapy: Secondary | ICD-10-CM | POA: Diagnosis not present

## 2016-01-16 DIAGNOSIS — R2689 Other abnormalities of gait and mobility: Secondary | ICD-10-CM | POA: Diagnosis not present

## 2016-01-16 DIAGNOSIS — J309 Allergic rhinitis, unspecified: Secondary | ICD-10-CM | POA: Diagnosis not present

## 2016-01-16 DIAGNOSIS — S32000S Wedge compression fracture of unspecified lumbar vertebra, sequela: Secondary | ICD-10-CM | POA: Diagnosis not present

## 2016-01-16 DIAGNOSIS — M6281 Muscle weakness (generalized): Secondary | ICD-10-CM | POA: Diagnosis not present

## 2016-01-16 DIAGNOSIS — I10 Essential (primary) hypertension: Secondary | ICD-10-CM | POA: Diagnosis not present

## 2016-01-23 DIAGNOSIS — G894 Chronic pain syndrome: Secondary | ICD-10-CM | POA: Diagnosis not present

## 2016-01-30 DIAGNOSIS — E782 Mixed hyperlipidemia: Secondary | ICD-10-CM | POA: Diagnosis not present

## 2016-01-30 DIAGNOSIS — I129 Hypertensive chronic kidney disease with stage 1 through stage 4 chronic kidney disease, or unspecified chronic kidney disease: Secondary | ICD-10-CM | POA: Diagnosis not present

## 2016-01-30 DIAGNOSIS — D649 Anemia, unspecified: Secondary | ICD-10-CM | POA: Diagnosis not present

## 2016-01-30 DIAGNOSIS — I358 Other nonrheumatic aortic valve disorders: Secondary | ICD-10-CM | POA: Diagnosis not present

## 2016-01-30 DIAGNOSIS — S22008D Other fracture of unspecified thoracic vertebra, subsequent encounter for fracture with routine healing: Secondary | ICD-10-CM | POA: Diagnosis not present

## 2016-01-30 DIAGNOSIS — N189 Chronic kidney disease, unspecified: Secondary | ICD-10-CM | POA: Diagnosis not present

## 2016-01-30 DIAGNOSIS — Z79891 Long term (current) use of opiate analgesic: Secondary | ICD-10-CM | POA: Diagnosis not present

## 2016-01-30 DIAGNOSIS — I4891 Unspecified atrial fibrillation: Secondary | ICD-10-CM | POA: Diagnosis not present

## 2016-01-30 DIAGNOSIS — Z9181 History of falling: Secondary | ICD-10-CM | POA: Diagnosis not present

## 2016-01-30 DIAGNOSIS — W19XXXD Unspecified fall, subsequent encounter: Secondary | ICD-10-CM | POA: Diagnosis not present

## 2016-01-30 DIAGNOSIS — S32008D Other fracture of unspecified lumbar vertebra, subsequent encounter for fracture with routine healing: Secondary | ICD-10-CM | POA: Diagnosis not present

## 2016-02-01 DIAGNOSIS — W19XXXD Unspecified fall, subsequent encounter: Secondary | ICD-10-CM | POA: Diagnosis not present

## 2016-02-01 DIAGNOSIS — I358 Other nonrheumatic aortic valve disorders: Secondary | ICD-10-CM | POA: Diagnosis not present

## 2016-02-01 DIAGNOSIS — S32008D Other fracture of unspecified lumbar vertebra, subsequent encounter for fracture with routine healing: Secondary | ICD-10-CM | POA: Diagnosis not present

## 2016-02-01 DIAGNOSIS — S22008D Other fracture of unspecified thoracic vertebra, subsequent encounter for fracture with routine healing: Secondary | ICD-10-CM | POA: Diagnosis not present

## 2016-02-01 DIAGNOSIS — I4891 Unspecified atrial fibrillation: Secondary | ICD-10-CM | POA: Diagnosis not present

## 2016-02-01 DIAGNOSIS — N189 Chronic kidney disease, unspecified: Secondary | ICD-10-CM | POA: Diagnosis not present

## 2016-02-01 DIAGNOSIS — I129 Hypertensive chronic kidney disease with stage 1 through stage 4 chronic kidney disease, or unspecified chronic kidney disease: Secondary | ICD-10-CM | POA: Diagnosis not present

## 2016-02-01 DIAGNOSIS — D649 Anemia, unspecified: Secondary | ICD-10-CM | POA: Diagnosis not present

## 2016-02-01 DIAGNOSIS — Z9181 History of falling: Secondary | ICD-10-CM | POA: Diagnosis not present

## 2016-02-01 DIAGNOSIS — Z79891 Long term (current) use of opiate analgesic: Secondary | ICD-10-CM | POA: Diagnosis not present

## 2016-02-04 DIAGNOSIS — N189 Chronic kidney disease, unspecified: Secondary | ICD-10-CM | POA: Diagnosis not present

## 2016-02-04 DIAGNOSIS — I358 Other nonrheumatic aortic valve disorders: Secondary | ICD-10-CM | POA: Diagnosis not present

## 2016-02-04 DIAGNOSIS — I129 Hypertensive chronic kidney disease with stage 1 through stage 4 chronic kidney disease, or unspecified chronic kidney disease: Secondary | ICD-10-CM | POA: Diagnosis not present

## 2016-02-04 DIAGNOSIS — S32008D Other fracture of unspecified lumbar vertebra, subsequent encounter for fracture with routine healing: Secondary | ICD-10-CM | POA: Diagnosis not present

## 2016-02-04 DIAGNOSIS — W19XXXD Unspecified fall, subsequent encounter: Secondary | ICD-10-CM | POA: Diagnosis not present

## 2016-02-04 DIAGNOSIS — I4891 Unspecified atrial fibrillation: Secondary | ICD-10-CM | POA: Diagnosis not present

## 2016-02-04 DIAGNOSIS — Z9181 History of falling: Secondary | ICD-10-CM | POA: Diagnosis not present

## 2016-02-04 DIAGNOSIS — D649 Anemia, unspecified: Secondary | ICD-10-CM | POA: Diagnosis not present

## 2016-02-04 DIAGNOSIS — Z79891 Long term (current) use of opiate analgesic: Secondary | ICD-10-CM | POA: Diagnosis not present

## 2016-02-04 DIAGNOSIS — S22008D Other fracture of unspecified thoracic vertebra, subsequent encounter for fracture with routine healing: Secondary | ICD-10-CM | POA: Diagnosis not present

## 2016-02-06 DIAGNOSIS — D649 Anemia, unspecified: Secondary | ICD-10-CM | POA: Diagnosis not present

## 2016-02-06 DIAGNOSIS — S32008D Other fracture of unspecified lumbar vertebra, subsequent encounter for fracture with routine healing: Secondary | ICD-10-CM | POA: Diagnosis not present

## 2016-02-06 DIAGNOSIS — I358 Other nonrheumatic aortic valve disorders: Secondary | ICD-10-CM | POA: Diagnosis not present

## 2016-02-06 DIAGNOSIS — I4891 Unspecified atrial fibrillation: Secondary | ICD-10-CM | POA: Diagnosis not present

## 2016-02-06 DIAGNOSIS — Z79891 Long term (current) use of opiate analgesic: Secondary | ICD-10-CM | POA: Diagnosis not present

## 2016-02-06 DIAGNOSIS — N189 Chronic kidney disease, unspecified: Secondary | ICD-10-CM | POA: Diagnosis not present

## 2016-02-06 DIAGNOSIS — Z9181 History of falling: Secondary | ICD-10-CM | POA: Diagnosis not present

## 2016-02-06 DIAGNOSIS — S22008D Other fracture of unspecified thoracic vertebra, subsequent encounter for fracture with routine healing: Secondary | ICD-10-CM | POA: Diagnosis not present

## 2016-02-06 DIAGNOSIS — W19XXXD Unspecified fall, subsequent encounter: Secondary | ICD-10-CM | POA: Diagnosis not present

## 2016-02-06 DIAGNOSIS — I129 Hypertensive chronic kidney disease with stage 1 through stage 4 chronic kidney disease, or unspecified chronic kidney disease: Secondary | ICD-10-CM | POA: Diagnosis not present

## 2016-02-07 DIAGNOSIS — I358 Other nonrheumatic aortic valve disorders: Secondary | ICD-10-CM | POA: Diagnosis not present

## 2016-02-07 DIAGNOSIS — Z9181 History of falling: Secondary | ICD-10-CM | POA: Diagnosis not present

## 2016-02-07 DIAGNOSIS — I129 Hypertensive chronic kidney disease with stage 1 through stage 4 chronic kidney disease, or unspecified chronic kidney disease: Secondary | ICD-10-CM | POA: Diagnosis not present

## 2016-02-07 DIAGNOSIS — Z79891 Long term (current) use of opiate analgesic: Secondary | ICD-10-CM | POA: Diagnosis not present

## 2016-02-07 DIAGNOSIS — S22008D Other fracture of unspecified thoracic vertebra, subsequent encounter for fracture with routine healing: Secondary | ICD-10-CM | POA: Diagnosis not present

## 2016-02-07 DIAGNOSIS — W19XXXD Unspecified fall, subsequent encounter: Secondary | ICD-10-CM | POA: Diagnosis not present

## 2016-02-07 DIAGNOSIS — I4891 Unspecified atrial fibrillation: Secondary | ICD-10-CM | POA: Diagnosis not present

## 2016-02-07 DIAGNOSIS — S32008D Other fracture of unspecified lumbar vertebra, subsequent encounter for fracture with routine healing: Secondary | ICD-10-CM | POA: Diagnosis not present

## 2016-02-07 DIAGNOSIS — N189 Chronic kidney disease, unspecified: Secondary | ICD-10-CM | POA: Diagnosis not present

## 2016-02-07 DIAGNOSIS — D649 Anemia, unspecified: Secondary | ICD-10-CM | POA: Diagnosis not present

## 2016-02-08 DIAGNOSIS — N189 Chronic kidney disease, unspecified: Secondary | ICD-10-CM | POA: Diagnosis not present

## 2016-02-08 DIAGNOSIS — I358 Other nonrheumatic aortic valve disorders: Secondary | ICD-10-CM | POA: Diagnosis not present

## 2016-02-08 DIAGNOSIS — Z79891 Long term (current) use of opiate analgesic: Secondary | ICD-10-CM | POA: Diagnosis not present

## 2016-02-08 DIAGNOSIS — S32008D Other fracture of unspecified lumbar vertebra, subsequent encounter for fracture with routine healing: Secondary | ICD-10-CM | POA: Diagnosis not present

## 2016-02-08 DIAGNOSIS — W19XXXD Unspecified fall, subsequent encounter: Secondary | ICD-10-CM | POA: Diagnosis not present

## 2016-02-08 DIAGNOSIS — D649 Anemia, unspecified: Secondary | ICD-10-CM | POA: Diagnosis not present

## 2016-02-08 DIAGNOSIS — I4891 Unspecified atrial fibrillation: Secondary | ICD-10-CM | POA: Diagnosis not present

## 2016-02-08 DIAGNOSIS — Z9181 History of falling: Secondary | ICD-10-CM | POA: Diagnosis not present

## 2016-02-08 DIAGNOSIS — I129 Hypertensive chronic kidney disease with stage 1 through stage 4 chronic kidney disease, or unspecified chronic kidney disease: Secondary | ICD-10-CM | POA: Diagnosis not present

## 2016-02-08 DIAGNOSIS — S22008D Other fracture of unspecified thoracic vertebra, subsequent encounter for fracture with routine healing: Secondary | ICD-10-CM | POA: Diagnosis not present

## 2016-02-11 DIAGNOSIS — D649 Anemia, unspecified: Secondary | ICD-10-CM | POA: Diagnosis not present

## 2016-02-11 DIAGNOSIS — I129 Hypertensive chronic kidney disease with stage 1 through stage 4 chronic kidney disease, or unspecified chronic kidney disease: Secondary | ICD-10-CM | POA: Diagnosis not present

## 2016-02-11 DIAGNOSIS — I358 Other nonrheumatic aortic valve disorders: Secondary | ICD-10-CM | POA: Diagnosis not present

## 2016-02-11 DIAGNOSIS — W19XXXD Unspecified fall, subsequent encounter: Secondary | ICD-10-CM | POA: Diagnosis not present

## 2016-02-11 DIAGNOSIS — S22008D Other fracture of unspecified thoracic vertebra, subsequent encounter for fracture with routine healing: Secondary | ICD-10-CM | POA: Diagnosis not present

## 2016-02-11 DIAGNOSIS — N189 Chronic kidney disease, unspecified: Secondary | ICD-10-CM | POA: Diagnosis not present

## 2016-02-11 DIAGNOSIS — I4891 Unspecified atrial fibrillation: Secondary | ICD-10-CM | POA: Diagnosis not present

## 2016-02-11 DIAGNOSIS — Z9181 History of falling: Secondary | ICD-10-CM | POA: Diagnosis not present

## 2016-02-11 DIAGNOSIS — S32008D Other fracture of unspecified lumbar vertebra, subsequent encounter for fracture with routine healing: Secondary | ICD-10-CM | POA: Diagnosis not present

## 2016-02-11 DIAGNOSIS — Z79891 Long term (current) use of opiate analgesic: Secondary | ICD-10-CM | POA: Diagnosis not present

## 2016-02-12 DIAGNOSIS — I358 Other nonrheumatic aortic valve disorders: Secondary | ICD-10-CM | POA: Diagnosis not present

## 2016-02-12 DIAGNOSIS — N189 Chronic kidney disease, unspecified: Secondary | ICD-10-CM | POA: Diagnosis not present

## 2016-02-12 DIAGNOSIS — Z79891 Long term (current) use of opiate analgesic: Secondary | ICD-10-CM | POA: Diagnosis not present

## 2016-02-12 DIAGNOSIS — I129 Hypertensive chronic kidney disease with stage 1 through stage 4 chronic kidney disease, or unspecified chronic kidney disease: Secondary | ICD-10-CM | POA: Diagnosis not present

## 2016-02-12 DIAGNOSIS — S22008D Other fracture of unspecified thoracic vertebra, subsequent encounter for fracture with routine healing: Secondary | ICD-10-CM | POA: Diagnosis not present

## 2016-02-12 DIAGNOSIS — S32008D Other fracture of unspecified lumbar vertebra, subsequent encounter for fracture with routine healing: Secondary | ICD-10-CM | POA: Diagnosis not present

## 2016-02-12 DIAGNOSIS — Z9181 History of falling: Secondary | ICD-10-CM | POA: Diagnosis not present

## 2016-02-12 DIAGNOSIS — W19XXXD Unspecified fall, subsequent encounter: Secondary | ICD-10-CM | POA: Diagnosis not present

## 2016-02-12 DIAGNOSIS — D649 Anemia, unspecified: Secondary | ICD-10-CM | POA: Diagnosis not present

## 2016-02-12 DIAGNOSIS — I4891 Unspecified atrial fibrillation: Secondary | ICD-10-CM | POA: Diagnosis not present

## 2016-02-14 DIAGNOSIS — I358 Other nonrheumatic aortic valve disorders: Secondary | ICD-10-CM | POA: Diagnosis not present

## 2016-02-14 DIAGNOSIS — I129 Hypertensive chronic kidney disease with stage 1 through stage 4 chronic kidney disease, or unspecified chronic kidney disease: Secondary | ICD-10-CM | POA: Diagnosis not present

## 2016-02-14 DIAGNOSIS — Z79891 Long term (current) use of opiate analgesic: Secondary | ICD-10-CM | POA: Diagnosis not present

## 2016-02-14 DIAGNOSIS — Z9181 History of falling: Secondary | ICD-10-CM | POA: Diagnosis not present

## 2016-02-14 DIAGNOSIS — S32008D Other fracture of unspecified lumbar vertebra, subsequent encounter for fracture with routine healing: Secondary | ICD-10-CM | POA: Diagnosis not present

## 2016-02-14 DIAGNOSIS — W19XXXD Unspecified fall, subsequent encounter: Secondary | ICD-10-CM | POA: Diagnosis not present

## 2016-02-14 DIAGNOSIS — D649 Anemia, unspecified: Secondary | ICD-10-CM | POA: Diagnosis not present

## 2016-02-14 DIAGNOSIS — I4891 Unspecified atrial fibrillation: Secondary | ICD-10-CM | POA: Diagnosis not present

## 2016-02-14 DIAGNOSIS — S22008D Other fracture of unspecified thoracic vertebra, subsequent encounter for fracture with routine healing: Secondary | ICD-10-CM | POA: Diagnosis not present

## 2016-02-14 DIAGNOSIS — N189 Chronic kidney disease, unspecified: Secondary | ICD-10-CM | POA: Diagnosis not present

## 2016-02-16 DIAGNOSIS — M6281 Muscle weakness (generalized): Secondary | ICD-10-CM | POA: Diagnosis not present

## 2016-02-16 DIAGNOSIS — S32000S Wedge compression fracture of unspecified lumbar vertebra, sequela: Secondary | ICD-10-CM | POA: Diagnosis not present

## 2016-02-16 DIAGNOSIS — I10 Essential (primary) hypertension: Secondary | ICD-10-CM | POA: Diagnosis not present

## 2016-02-16 DIAGNOSIS — R296 Repeated falls: Secondary | ICD-10-CM | POA: Diagnosis not present

## 2016-02-16 DIAGNOSIS — J309 Allergic rhinitis, unspecified: Secondary | ICD-10-CM | POA: Diagnosis not present

## 2016-02-18 DIAGNOSIS — N189 Chronic kidney disease, unspecified: Secondary | ICD-10-CM | POA: Diagnosis not present

## 2016-02-18 DIAGNOSIS — S32008D Other fracture of unspecified lumbar vertebra, subsequent encounter for fracture with routine healing: Secondary | ICD-10-CM | POA: Diagnosis not present

## 2016-02-18 DIAGNOSIS — Z79891 Long term (current) use of opiate analgesic: Secondary | ICD-10-CM | POA: Diagnosis not present

## 2016-02-18 DIAGNOSIS — I129 Hypertensive chronic kidney disease with stage 1 through stage 4 chronic kidney disease, or unspecified chronic kidney disease: Secondary | ICD-10-CM | POA: Diagnosis not present

## 2016-02-18 DIAGNOSIS — I358 Other nonrheumatic aortic valve disorders: Secondary | ICD-10-CM | POA: Diagnosis not present

## 2016-02-18 DIAGNOSIS — I4891 Unspecified atrial fibrillation: Secondary | ICD-10-CM | POA: Diagnosis not present

## 2016-02-18 DIAGNOSIS — D649 Anemia, unspecified: Secondary | ICD-10-CM | POA: Diagnosis not present

## 2016-02-18 DIAGNOSIS — W19XXXD Unspecified fall, subsequent encounter: Secondary | ICD-10-CM | POA: Diagnosis not present

## 2016-02-18 DIAGNOSIS — Z9181 History of falling: Secondary | ICD-10-CM | POA: Diagnosis not present

## 2016-02-18 DIAGNOSIS — S22008D Other fracture of unspecified thoracic vertebra, subsequent encounter for fracture with routine healing: Secondary | ICD-10-CM | POA: Diagnosis not present

## 2016-02-20 DIAGNOSIS — S32008D Other fracture of unspecified lumbar vertebra, subsequent encounter for fracture with routine healing: Secondary | ICD-10-CM | POA: Diagnosis not present

## 2016-02-20 DIAGNOSIS — Z9181 History of falling: Secondary | ICD-10-CM | POA: Diagnosis not present

## 2016-02-20 DIAGNOSIS — I358 Other nonrheumatic aortic valve disorders: Secondary | ICD-10-CM | POA: Diagnosis not present

## 2016-02-20 DIAGNOSIS — D649 Anemia, unspecified: Secondary | ICD-10-CM | POA: Diagnosis not present

## 2016-02-20 DIAGNOSIS — W19XXXD Unspecified fall, subsequent encounter: Secondary | ICD-10-CM | POA: Diagnosis not present

## 2016-02-20 DIAGNOSIS — I129 Hypertensive chronic kidney disease with stage 1 through stage 4 chronic kidney disease, or unspecified chronic kidney disease: Secondary | ICD-10-CM | POA: Diagnosis not present

## 2016-02-20 DIAGNOSIS — S22008D Other fracture of unspecified thoracic vertebra, subsequent encounter for fracture with routine healing: Secondary | ICD-10-CM | POA: Diagnosis not present

## 2016-02-20 DIAGNOSIS — N189 Chronic kidney disease, unspecified: Secondary | ICD-10-CM | POA: Diagnosis not present

## 2016-02-20 DIAGNOSIS — Z79891 Long term (current) use of opiate analgesic: Secondary | ICD-10-CM | POA: Diagnosis not present

## 2016-02-20 DIAGNOSIS — I4891 Unspecified atrial fibrillation: Secondary | ICD-10-CM | POA: Diagnosis not present

## 2016-02-20 DIAGNOSIS — G894 Chronic pain syndrome: Secondary | ICD-10-CM | POA: Diagnosis not present

## 2016-02-21 DIAGNOSIS — S22008D Other fracture of unspecified thoracic vertebra, subsequent encounter for fracture with routine healing: Secondary | ICD-10-CM | POA: Diagnosis not present

## 2016-02-21 DIAGNOSIS — I358 Other nonrheumatic aortic valve disorders: Secondary | ICD-10-CM | POA: Diagnosis not present

## 2016-02-21 DIAGNOSIS — I4891 Unspecified atrial fibrillation: Secondary | ICD-10-CM | POA: Diagnosis not present

## 2016-02-21 DIAGNOSIS — S32008D Other fracture of unspecified lumbar vertebra, subsequent encounter for fracture with routine healing: Secondary | ICD-10-CM | POA: Diagnosis not present

## 2016-02-21 DIAGNOSIS — W19XXXD Unspecified fall, subsequent encounter: Secondary | ICD-10-CM | POA: Diagnosis not present

## 2016-02-21 DIAGNOSIS — Z9181 History of falling: Secondary | ICD-10-CM | POA: Diagnosis not present

## 2016-02-21 DIAGNOSIS — N189 Chronic kidney disease, unspecified: Secondary | ICD-10-CM | POA: Diagnosis not present

## 2016-02-21 DIAGNOSIS — I129 Hypertensive chronic kidney disease with stage 1 through stage 4 chronic kidney disease, or unspecified chronic kidney disease: Secondary | ICD-10-CM | POA: Diagnosis not present

## 2016-02-21 DIAGNOSIS — Z79891 Long term (current) use of opiate analgesic: Secondary | ICD-10-CM | POA: Diagnosis not present

## 2016-02-21 DIAGNOSIS — D649 Anemia, unspecified: Secondary | ICD-10-CM | POA: Diagnosis not present

## 2016-02-26 DIAGNOSIS — Z79891 Long term (current) use of opiate analgesic: Secondary | ICD-10-CM | POA: Diagnosis not present

## 2016-02-26 DIAGNOSIS — I129 Hypertensive chronic kidney disease with stage 1 through stage 4 chronic kidney disease, or unspecified chronic kidney disease: Secondary | ICD-10-CM | POA: Diagnosis not present

## 2016-02-26 DIAGNOSIS — I358 Other nonrheumatic aortic valve disorders: Secondary | ICD-10-CM | POA: Diagnosis not present

## 2016-02-26 DIAGNOSIS — D649 Anemia, unspecified: Secondary | ICD-10-CM | POA: Diagnosis not present

## 2016-02-26 DIAGNOSIS — S32008D Other fracture of unspecified lumbar vertebra, subsequent encounter for fracture with routine healing: Secondary | ICD-10-CM | POA: Diagnosis not present

## 2016-02-26 DIAGNOSIS — I4891 Unspecified atrial fibrillation: Secondary | ICD-10-CM | POA: Diagnosis not present

## 2016-02-26 DIAGNOSIS — N189 Chronic kidney disease, unspecified: Secondary | ICD-10-CM | POA: Diagnosis not present

## 2016-02-26 DIAGNOSIS — S22008D Other fracture of unspecified thoracic vertebra, subsequent encounter for fracture with routine healing: Secondary | ICD-10-CM | POA: Diagnosis not present

## 2016-02-26 DIAGNOSIS — W19XXXD Unspecified fall, subsequent encounter: Secondary | ICD-10-CM | POA: Diagnosis not present

## 2016-02-26 DIAGNOSIS — Z9181 History of falling: Secondary | ICD-10-CM | POA: Diagnosis not present

## 2016-02-27 DIAGNOSIS — S32008D Other fracture of unspecified lumbar vertebra, subsequent encounter for fracture with routine healing: Secondary | ICD-10-CM | POA: Diagnosis not present

## 2016-02-27 DIAGNOSIS — N189 Chronic kidney disease, unspecified: Secondary | ICD-10-CM | POA: Diagnosis not present

## 2016-02-27 DIAGNOSIS — I4891 Unspecified atrial fibrillation: Secondary | ICD-10-CM | POA: Diagnosis not present

## 2016-02-27 DIAGNOSIS — W19XXXD Unspecified fall, subsequent encounter: Secondary | ICD-10-CM | POA: Diagnosis not present

## 2016-02-27 DIAGNOSIS — Z9181 History of falling: Secondary | ICD-10-CM | POA: Diagnosis not present

## 2016-02-27 DIAGNOSIS — I129 Hypertensive chronic kidney disease with stage 1 through stage 4 chronic kidney disease, or unspecified chronic kidney disease: Secondary | ICD-10-CM | POA: Diagnosis not present

## 2016-02-27 DIAGNOSIS — I358 Other nonrheumatic aortic valve disorders: Secondary | ICD-10-CM | POA: Diagnosis not present

## 2016-02-27 DIAGNOSIS — D649 Anemia, unspecified: Secondary | ICD-10-CM | POA: Diagnosis not present

## 2016-02-27 DIAGNOSIS — Z79891 Long term (current) use of opiate analgesic: Secondary | ICD-10-CM | POA: Diagnosis not present

## 2016-02-27 DIAGNOSIS — S22008D Other fracture of unspecified thoracic vertebra, subsequent encounter for fracture with routine healing: Secondary | ICD-10-CM | POA: Diagnosis not present

## 2016-02-28 DIAGNOSIS — N189 Chronic kidney disease, unspecified: Secondary | ICD-10-CM | POA: Diagnosis not present

## 2016-02-28 DIAGNOSIS — S22008D Other fracture of unspecified thoracic vertebra, subsequent encounter for fracture with routine healing: Secondary | ICD-10-CM | POA: Diagnosis not present

## 2016-02-28 DIAGNOSIS — Z79891 Long term (current) use of opiate analgesic: Secondary | ICD-10-CM | POA: Diagnosis not present

## 2016-02-28 DIAGNOSIS — I4891 Unspecified atrial fibrillation: Secondary | ICD-10-CM | POA: Diagnosis not present

## 2016-02-28 DIAGNOSIS — S32008D Other fracture of unspecified lumbar vertebra, subsequent encounter for fracture with routine healing: Secondary | ICD-10-CM | POA: Diagnosis not present

## 2016-02-28 DIAGNOSIS — W19XXXD Unspecified fall, subsequent encounter: Secondary | ICD-10-CM | POA: Diagnosis not present

## 2016-02-28 DIAGNOSIS — Z9181 History of falling: Secondary | ICD-10-CM | POA: Diagnosis not present

## 2016-02-28 DIAGNOSIS — D649 Anemia, unspecified: Secondary | ICD-10-CM | POA: Diagnosis not present

## 2016-02-28 DIAGNOSIS — I129 Hypertensive chronic kidney disease with stage 1 through stage 4 chronic kidney disease, or unspecified chronic kidney disease: Secondary | ICD-10-CM | POA: Diagnosis not present

## 2016-02-28 DIAGNOSIS — I358 Other nonrheumatic aortic valve disorders: Secondary | ICD-10-CM | POA: Diagnosis not present

## 2016-02-29 DIAGNOSIS — D649 Anemia, unspecified: Secondary | ICD-10-CM | POA: Diagnosis not present

## 2016-02-29 DIAGNOSIS — I358 Other nonrheumatic aortic valve disorders: Secondary | ICD-10-CM | POA: Diagnosis not present

## 2016-02-29 DIAGNOSIS — E782 Mixed hyperlipidemia: Secondary | ICD-10-CM | POA: Diagnosis not present

## 2016-02-29 DIAGNOSIS — I4891 Unspecified atrial fibrillation: Secondary | ICD-10-CM | POA: Diagnosis not present

## 2016-03-05 DIAGNOSIS — W19XXXD Unspecified fall, subsequent encounter: Secondary | ICD-10-CM | POA: Diagnosis not present

## 2016-03-05 DIAGNOSIS — S22008D Other fracture of unspecified thoracic vertebra, subsequent encounter for fracture with routine healing: Secondary | ICD-10-CM | POA: Diagnosis not present

## 2016-03-05 DIAGNOSIS — I4891 Unspecified atrial fibrillation: Secondary | ICD-10-CM | POA: Diagnosis not present

## 2016-03-05 DIAGNOSIS — Z9181 History of falling: Secondary | ICD-10-CM | POA: Diagnosis not present

## 2016-03-05 DIAGNOSIS — I129 Hypertensive chronic kidney disease with stage 1 through stage 4 chronic kidney disease, or unspecified chronic kidney disease: Secondary | ICD-10-CM | POA: Diagnosis not present

## 2016-03-05 DIAGNOSIS — Z79891 Long term (current) use of opiate analgesic: Secondary | ICD-10-CM | POA: Diagnosis not present

## 2016-03-05 DIAGNOSIS — I358 Other nonrheumatic aortic valve disorders: Secondary | ICD-10-CM | POA: Diagnosis not present

## 2016-03-05 DIAGNOSIS — D649 Anemia, unspecified: Secondary | ICD-10-CM | POA: Diagnosis not present

## 2016-03-05 DIAGNOSIS — N189 Chronic kidney disease, unspecified: Secondary | ICD-10-CM | POA: Diagnosis not present

## 2016-03-05 DIAGNOSIS — S32008D Other fracture of unspecified lumbar vertebra, subsequent encounter for fracture with routine healing: Secondary | ICD-10-CM | POA: Diagnosis not present

## 2016-03-07 DIAGNOSIS — I129 Hypertensive chronic kidney disease with stage 1 through stage 4 chronic kidney disease, or unspecified chronic kidney disease: Secondary | ICD-10-CM | POA: Diagnosis not present

## 2016-03-07 DIAGNOSIS — D649 Anemia, unspecified: Secondary | ICD-10-CM | POA: Diagnosis not present

## 2016-03-07 DIAGNOSIS — S32008D Other fracture of unspecified lumbar vertebra, subsequent encounter for fracture with routine healing: Secondary | ICD-10-CM | POA: Diagnosis not present

## 2016-03-07 DIAGNOSIS — I4891 Unspecified atrial fibrillation: Secondary | ICD-10-CM | POA: Diagnosis not present

## 2016-03-07 DIAGNOSIS — Z9181 History of falling: Secondary | ICD-10-CM | POA: Diagnosis not present

## 2016-03-07 DIAGNOSIS — Z79891 Long term (current) use of opiate analgesic: Secondary | ICD-10-CM | POA: Diagnosis not present

## 2016-03-07 DIAGNOSIS — N189 Chronic kidney disease, unspecified: Secondary | ICD-10-CM | POA: Diagnosis not present

## 2016-03-07 DIAGNOSIS — W19XXXD Unspecified fall, subsequent encounter: Secondary | ICD-10-CM | POA: Diagnosis not present

## 2016-03-07 DIAGNOSIS — S22008D Other fracture of unspecified thoracic vertebra, subsequent encounter for fracture with routine healing: Secondary | ICD-10-CM | POA: Diagnosis not present

## 2016-03-07 DIAGNOSIS — I358 Other nonrheumatic aortic valve disorders: Secondary | ICD-10-CM | POA: Diagnosis not present

## 2016-03-11 DIAGNOSIS — W19XXXD Unspecified fall, subsequent encounter: Secondary | ICD-10-CM | POA: Diagnosis not present

## 2016-03-11 DIAGNOSIS — Z79891 Long term (current) use of opiate analgesic: Secondary | ICD-10-CM | POA: Diagnosis not present

## 2016-03-11 DIAGNOSIS — Z9181 History of falling: Secondary | ICD-10-CM | POA: Diagnosis not present

## 2016-03-11 DIAGNOSIS — S22008D Other fracture of unspecified thoracic vertebra, subsequent encounter for fracture with routine healing: Secondary | ICD-10-CM | POA: Diagnosis not present

## 2016-03-11 DIAGNOSIS — S32008D Other fracture of unspecified lumbar vertebra, subsequent encounter for fracture with routine healing: Secondary | ICD-10-CM | POA: Diagnosis not present

## 2016-03-11 DIAGNOSIS — I358 Other nonrheumatic aortic valve disorders: Secondary | ICD-10-CM | POA: Diagnosis not present

## 2016-03-11 DIAGNOSIS — I129 Hypertensive chronic kidney disease with stage 1 through stage 4 chronic kidney disease, or unspecified chronic kidney disease: Secondary | ICD-10-CM | POA: Diagnosis not present

## 2016-03-11 DIAGNOSIS — I4891 Unspecified atrial fibrillation: Secondary | ICD-10-CM | POA: Diagnosis not present

## 2016-03-11 DIAGNOSIS — N189 Chronic kidney disease, unspecified: Secondary | ICD-10-CM | POA: Diagnosis not present

## 2016-03-11 DIAGNOSIS — D649 Anemia, unspecified: Secondary | ICD-10-CM | POA: Diagnosis not present

## 2016-03-12 DIAGNOSIS — D649 Anemia, unspecified: Secondary | ICD-10-CM | POA: Diagnosis not present

## 2016-03-12 DIAGNOSIS — Z0000 Encounter for general adult medical examination without abnormal findings: Secondary | ICD-10-CM | POA: Diagnosis not present

## 2016-03-12 DIAGNOSIS — I10 Essential (primary) hypertension: Secondary | ICD-10-CM | POA: Diagnosis not present

## 2016-03-21 ENCOUNTER — Emergency Department (HOSPITAL_COMMUNITY): Payer: Medicare Other

## 2016-03-21 ENCOUNTER — Emergency Department (HOSPITAL_COMMUNITY)
Admission: EM | Admit: 2016-03-21 | Discharge: 2016-03-22 | Disposition: A | Discharge status: Home / Self Care | Payer: Medicare Other | Attending: Emergency Medicine | Admitting: Emergency Medicine

## 2016-03-21 ENCOUNTER — Encounter (HOSPITAL_COMMUNITY): Payer: Self-pay

## 2016-03-21 DIAGNOSIS — M25512 Pain in left shoulder: Secondary | ICD-10-CM | POA: Diagnosis not present

## 2016-03-21 DIAGNOSIS — Z79899 Other long term (current) drug therapy: Secondary | ICD-10-CM | POA: Diagnosis not present

## 2016-03-21 DIAGNOSIS — M25511 Pain in right shoulder: Secondary | ICD-10-CM | POA: Diagnosis not present

## 2016-03-21 DIAGNOSIS — N189 Chronic kidney disease, unspecified: Secondary | ICD-10-CM | POA: Diagnosis not present

## 2016-03-21 DIAGNOSIS — R0789 Other chest pain: Secondary | ICD-10-CM | POA: Insufficient documentation

## 2016-03-21 DIAGNOSIS — R079 Chest pain, unspecified: Secondary | ICD-10-CM | POA: Diagnosis present

## 2016-03-21 DIAGNOSIS — I129 Hypertensive chronic kidney disease with stage 1 through stage 4 chronic kidney disease, or unspecified chronic kidney disease: Secondary | ICD-10-CM | POA: Diagnosis not present

## 2016-03-21 DIAGNOSIS — M25519 Pain in unspecified shoulder: Secondary | ICD-10-CM | POA: Diagnosis not present

## 2016-03-21 LAB — I-STAT CHEM 8, ED
BUN: 9 mg/dL (ref 6–20)
CALCIUM ION: 1.19 mmol/L (ref 1.15–1.40)
Chloride: 106 mmol/L (ref 101–111)
Creatinine, Ser: 1.5 mg/dL — ABNORMAL HIGH (ref 0.44–1.00)
GLUCOSE: 112 mg/dL — AB (ref 65–99)
HCT: 29 % — ABNORMAL LOW (ref 36.0–46.0)
HEMOGLOBIN: 9.9 g/dL — AB (ref 12.0–15.0)
Potassium: 4.4 mmol/L (ref 3.5–5.1)
SODIUM: 139 mmol/L (ref 135–145)
TCO2: 23 mmol/L (ref 0–100)

## 2016-03-21 LAB — CBC WITH DIFFERENTIAL/PLATELET
Basophils Absolute: 0 10*3/uL (ref 0.0–0.1)
Basophils Relative: 0 %
EOS ABS: 0.2 10*3/uL (ref 0.0–0.7)
EOS PCT: 4 %
HCT: 29 % — ABNORMAL LOW (ref 36.0–46.0)
Hemoglobin: 10.1 g/dL — ABNORMAL LOW (ref 12.0–15.0)
LYMPHS ABS: 2.1 10*3/uL (ref 0.7–4.0)
Lymphocytes Relative: 46 %
MCH: 33.1 pg (ref 26.0–34.0)
MCHC: 34.8 g/dL (ref 30.0–36.0)
MCV: 95.1 fL (ref 78.0–100.0)
MONO ABS: 0.3 10*3/uL (ref 0.1–1.0)
Monocytes Relative: 8 %
Neutro Abs: 1.9 10*3/uL (ref 1.7–7.7)
Neutrophils Relative %: 42 %
PLATELETS: 163 10*3/uL (ref 150–400)
RBC: 3.05 MIL/uL — AB (ref 3.87–5.11)
RDW: 12.7 % (ref 11.5–15.5)
WBC: 4.6 10*3/uL (ref 4.0–10.5)

## 2016-03-21 LAB — I-STAT TROPONIN, ED: Troponin i, poc: 0.02 ng/mL (ref 0.00–0.08)

## 2016-03-21 NOTE — ED Notes (Signed)
[facility] Expected date:  Expected time:  Means of arrival:  Comments: EMS 83 yo

## 2016-03-21 NOTE — ED Notes (Signed)
Blood draw delayed pt currently in xray.  RN sts he will collect labs on pt's return

## 2016-03-21 NOTE — ED Provider Notes (Signed)
[facility]-EMERGENCY DEPT Provider Note   CSN: [id number] Arrival date & time: 03/21/16  2040     History   Chief Complaint Chief Complaint  Patient presents with  . Shoulder Pain    HPI Crystal Rowland is a 83 y.o. female.  Patient is a 83 year old female with a history of atrial fibrillation, anemia, aortic stenosis, renal insufficiency presenting today from her skilled facility. Patient was eating dinner when she developed pain in her chest radiating into her shoulders and back. She states this lasted around 10 minutes and is now resolved. Speaking with the facility they stated she only had pain. She had eaten more tonight than she normally eats but did not appear to have any trouble breathing, diaphoresis or vomiting. Currently patient states she feels much better and has no complaints   The history is provided by the patient and the nursing home.  Shoulder Pain   This is a new problem. The current episode started 1 to 2 hours ago. The problem occurs constantly. The problem has been resolved. The pain is present in the left shoulder and right shoulder (upper back and chest). The quality of the pain is described as sharp. The pain is at a severity of 6/10. The pain is moderate. Associated symptoms comments: No SOB, diaphoresis, vomiting or abd pain.. She has tried nothing for the symptoms. The treatment provided significant relief. There has been no history of extremity trauma.    Past Medical History:  Diagnosis Date  . Hypertension     Patient Active Problem List   Diagnosis Date Noted  . Hyperlipidemia 06/24/2011  . Chronic renal insufficiency 06/24/2011  . Aortic valve stenosis 06/24/2011  . Atrial fibrillation (HCC) 04/11/2011  . Anemia 04/11/2011    History reviewed. No pertinent surgical history.  OB History    No data available       Home Medications    Prior to Admission medications   Medication Sig Start Date End Date Taking? Authorizing Provider    acetaminophen (TYLENOL) 500 MG tablet Take 500 mg by mouth 2 (two) times daily.   Yes Historical Provider, MD  amLODipine (NORVASC) 2.5 MG tablet Take 2.5 mg by mouth 2 (two) times daily.   Yes Historical Provider, MD  clindamycin (CLEOCIN) 300 MG capsule Take 600 mg by mouth daily as needed (one hour prior to denatl appointment).   Yes Historical Provider, MD  divalproex (DEPAKOTE) 250 MG DR tablet Take 250 mg by mouth 2 (two) times daily.   Yes Historical Provider, MD  donepezil (ARICEPT) 5 MG tablet Take 5 mg by mouth at bedtime.   Yes Historical Provider, MD  HYDROcodone-acetaminophen (NORCO/VICODIN) 5-325 MG tablet Take 1-2 tablets by mouth every 6 (six) hours as needed for severe pain. 12/28/15  Yes Ambrose Finland Little, MD  LORazepam (ATIVAN) 0.5 MG tablet Take 0.5 mg by mouth every 12 (twelve) hours as needed for anxiety.    Yes Historical Provider, MD  Multiple Vitamins-Iron (MULTIVITAMINS WITH IRON) TABS tablet Take 1 tablet by mouth daily.   Yes Historical Provider, MD  polyethylene glycol (MIRALAX / GLYCOLAX) packet Take 17 g by mouth daily.   Yes Historical Provider, MD  polyvinyl alcohol (LIQUIFILM TEARS) 1.4 % ophthalmic solution Place 1 drop into both eyes 3 (three) times daily.   Yes Historical Provider, MD  QUEtiapine (SEROQUEL) 100 MG tablet Take 100 mg by mouth at bedtime.   Yes Historical Provider, MD    Family History No family history on file.  Social  History Social History  Substance Use Topics  . Smoking status: Never Smoker  . Smokeless tobacco: Never Used  . Alcohol use No     Allergies   Patient has no known allergies.   Review of Systems Review of Systems  All other systems reviewed and are negative.    Physical Exam Updated Vital Signs BP (!) 116/49 (BP Location: Left Arm)   Pulse 60   Temp 97.6 F (36.4 C) (Oral)   Resp 18   SpO2 99%   Physical Exam  Constitutional: She is oriented to person, place, and time. She appears well-developed and  well-nourished. No distress.  HENT:  Head: Normocephalic and atraumatic.  Mouth/Throat: Oropharynx is clear and moist.  Eyes: Conjunctivae and EOM are normal. Pupils are equal, round, and reactive to light.  Neck: Normal range of motion. Neck supple.  Cardiovascular: Normal rate, regular rhythm and intact distal pulses.   Murmur heard.  Systolic murmur is present with a grade of 2/6  Murmur heard best at left sternal border  Pulmonary/Chest: Effort normal and breath sounds normal. No respiratory distress. She has no wheezes. She has no rales.  Abdominal: Soft. She exhibits no distension. There is no tenderness. There is no rebound and no guarding.  Musculoskeletal: Normal range of motion. She exhibits no edema or tenderness.  Neurological: She is alert and oriented to person, place, and time.  Skin: Skin is warm and dry. No rash noted. No erythema.  Psychiatric: She has a normal mood and affect. Her behavior is normal.  Nursing note and vitals reviewed.    ED Treatments / Results  Labs (all labs ordered are listed, but only abnormal results are displayed) Labs Reviewed  CBC WITH DIFFERENTIAL/PLATELET - Abnormal; Notable for the following:       Result Value   RBC 3.05 (*)    Hemoglobin 10.1 (*)    HCT 29.0 (*)    All other components within normal limits  I-STAT CHEM 8, ED - Abnormal; Notable for the following:    Creatinine, Ser 1.50 (*)    Glucose, Bld 112 (*)    Hemoglobin 9.9 (*)    HCT 29.0 (*)    All other components within normal limits  I-STAT TROPOININ, ED  I-STAT TROPOININ, ED    EKG  EKG Interpretation  Date/Time:  Friday March 21 2016 21:27:27 EST Ventricular Rate:  71 PR Interval:    QRS Duration: 98 QT Interval:  408 QTC Calculation: 444 R Axis:   8 Text Interpretation:  Sinus rhythm Nonspecific T abnormalities, diffuse leads No previous tracing Confirmed by Anitra LauthPLUNKETT  MD, Alphonzo LemmingsWHITNEY ([id number]) on 03/21/2016 9:45:55 PM       Radiology Dg Chest 2  View  Result Date: 03/21/2016 CLINICAL DATA:  Sudden onset of bilateral shoulder pain tonight while eating dinner. No current complaints of pain. Hx of HTN. Nonsmoker. EXAM: CHEST  2 VIEW COMPARISON:  None. FINDINGS: Normal cardiac silhouette. No effusion, infiltrate pneumothorax. Degenerative osteophytosis of the spine. Compression deformity of the T11 vertebral body. IMPRESSION: No acute cardiopulmonary process. Electronically Signed   By: Genevive BiStewart  Edmunds M.D.   On: 03/21/2016 21:31    Procedures Procedures (including critical care time)  Medications Ordered in ED Medications - No data to display   Initial Impression / Assessment and Plan / ED Course  I have reviewed the triage vital signs and the nursing notes.  Pertinent labs & imaging results that were available during my care of the patient were reviewed by  me and considered in my medical decision making (see chart for details).   patient is an elderly female with aortic stenosis, atrial fibrillation and chronic renal insufficiency presenting today with chest pain while eating that radiated into her arms and back. She states she ate much more than she normally does and was eating when this started. It lasted approximately 10 minutes but is gone now. Speaking with the facility they did not notice any signs of diaphoresis no vomiting or shortness of breath. Patient has no prior history of MI and no prior stents. On exam she has a murmur consistent with aortic stenosis. She is in no acute distress and has no reproducible pain. EKG today does show T-wave inversions in the inferior lateral leads which is new however her last EKG was done 7 years ago. Patient's initial troponin, Chem-8 and CBC are all unchanged. Troponin negative. Discussed findings with her family. Will repeat a troponin however if it is normal they desire that she return to her facility. Discussed with family that her age there is minimal intervention that could occur and if her  troponin was elevated we can't admit her for medical management but she would not be a candidate for catheterization.  Repeat troponin will be drawn at midnight which should be greater than 3 hours after the event occurred.  12:23 AM Troponin is unchanged and will discharge patient back to the facility to follow-up with her regular doctor.  Final Clinical Impressions(s) / ED Diagnoses   Final diagnoses:  Atypical chest pain    New Prescriptions New Prescriptions   No medications on file     Gwyneth Sprout, MD 03/22/16 [phone]

## 2016-03-21 NOTE — ED Notes (Signed)
Per EMS- Pt from [facility]  c/o sudden onset bilateral shoulder pain while eating dinner, no other symptoms. Currently not complaining of any pain.

## 2016-03-22 DIAGNOSIS — D649 Anemia, unspecified: Secondary | ICD-10-CM | POA: Diagnosis not present

## 2016-03-22 DIAGNOSIS — I358 Other nonrheumatic aortic valve disorders: Secondary | ICD-10-CM | POA: Diagnosis not present

## 2016-03-22 DIAGNOSIS — S22008D Other fracture of unspecified thoracic vertebra, subsequent encounter for fracture with routine healing: Secondary | ICD-10-CM | POA: Diagnosis not present

## 2016-03-22 DIAGNOSIS — S32008D Other fracture of unspecified lumbar vertebra, subsequent encounter for fracture with routine healing: Secondary | ICD-10-CM | POA: Diagnosis not present

## 2016-03-22 DIAGNOSIS — I4891 Unspecified atrial fibrillation: Secondary | ICD-10-CM | POA: Diagnosis not present

## 2016-03-22 DIAGNOSIS — Z9181 History of falling: Secondary | ICD-10-CM | POA: Diagnosis not present

## 2016-03-22 DIAGNOSIS — N189 Chronic kidney disease, unspecified: Secondary | ICD-10-CM | POA: Diagnosis not present

## 2016-03-22 DIAGNOSIS — Z79891 Long term (current) use of opiate analgesic: Secondary | ICD-10-CM | POA: Diagnosis not present

## 2016-03-22 DIAGNOSIS — W19XXXD Unspecified fall, subsequent encounter: Secondary | ICD-10-CM | POA: Diagnosis not present

## 2016-03-22 DIAGNOSIS — I129 Hypertensive chronic kidney disease with stage 1 through stage 4 chronic kidney disease, or unspecified chronic kidney disease: Secondary | ICD-10-CM | POA: Diagnosis not present

## 2016-03-22 LAB — I-STAT TROPONIN, ED: TROPONIN I, POC: 0.02 ng/mL (ref 0.00–0.08)

## 2016-03-25 DIAGNOSIS — N189 Chronic kidney disease, unspecified: Secondary | ICD-10-CM | POA: Diagnosis not present

## 2016-03-25 DIAGNOSIS — I129 Hypertensive chronic kidney disease with stage 1 through stage 4 chronic kidney disease, or unspecified chronic kidney disease: Secondary | ICD-10-CM | POA: Diagnosis not present

## 2016-03-25 DIAGNOSIS — I358 Other nonrheumatic aortic valve disorders: Secondary | ICD-10-CM | POA: Diagnosis not present

## 2016-03-25 DIAGNOSIS — S32008D Other fracture of unspecified lumbar vertebra, subsequent encounter for fracture with routine healing: Secondary | ICD-10-CM | POA: Diagnosis not present

## 2016-03-25 DIAGNOSIS — I4891 Unspecified atrial fibrillation: Secondary | ICD-10-CM | POA: Diagnosis not present

## 2016-03-25 DIAGNOSIS — D649 Anemia, unspecified: Secondary | ICD-10-CM | POA: Diagnosis not present

## 2016-03-25 DIAGNOSIS — Z79891 Long term (current) use of opiate analgesic: Secondary | ICD-10-CM | POA: Diagnosis not present

## 2016-03-25 DIAGNOSIS — Z9181 History of falling: Secondary | ICD-10-CM | POA: Diagnosis not present

## 2016-03-25 DIAGNOSIS — W19XXXD Unspecified fall, subsequent encounter: Secondary | ICD-10-CM | POA: Diagnosis not present

## 2016-03-25 DIAGNOSIS — S22008D Other fracture of unspecified thoracic vertebra, subsequent encounter for fracture with routine healing: Secondary | ICD-10-CM | POA: Diagnosis not present

## 2016-03-26 DIAGNOSIS — S22008D Other fracture of unspecified thoracic vertebra, subsequent encounter for fracture with routine healing: Secondary | ICD-10-CM | POA: Diagnosis not present

## 2016-03-26 DIAGNOSIS — I358 Other nonrheumatic aortic valve disorders: Secondary | ICD-10-CM | POA: Diagnosis not present

## 2016-03-26 DIAGNOSIS — N189 Chronic kidney disease, unspecified: Secondary | ICD-10-CM | POA: Diagnosis not present

## 2016-03-26 DIAGNOSIS — W19XXXD Unspecified fall, subsequent encounter: Secondary | ICD-10-CM | POA: Diagnosis not present

## 2016-03-26 DIAGNOSIS — S32008D Other fracture of unspecified lumbar vertebra, subsequent encounter for fracture with routine healing: Secondary | ICD-10-CM | POA: Diagnosis not present

## 2016-03-26 DIAGNOSIS — Z9181 History of falling: Secondary | ICD-10-CM | POA: Diagnosis not present

## 2016-03-26 DIAGNOSIS — G894 Chronic pain syndrome: Secondary | ICD-10-CM | POA: Diagnosis not present

## 2016-03-26 DIAGNOSIS — I4891 Unspecified atrial fibrillation: Secondary | ICD-10-CM | POA: Diagnosis not present

## 2016-03-26 DIAGNOSIS — I129 Hypertensive chronic kidney disease with stage 1 through stage 4 chronic kidney disease, or unspecified chronic kidney disease: Secondary | ICD-10-CM | POA: Diagnosis not present

## 2016-03-26 DIAGNOSIS — Z79891 Long term (current) use of opiate analgesic: Secondary | ICD-10-CM | POA: Diagnosis not present

## 2016-03-26 DIAGNOSIS — D649 Anemia, unspecified: Secondary | ICD-10-CM | POA: Diagnosis not present

## 2016-04-02 DIAGNOSIS — E782 Mixed hyperlipidemia: Secondary | ICD-10-CM | POA: Diagnosis not present

## 2016-04-02 DIAGNOSIS — D649 Anemia, unspecified: Secondary | ICD-10-CM | POA: Diagnosis not present

## 2016-04-02 DIAGNOSIS — I4891 Unspecified atrial fibrillation: Secondary | ICD-10-CM | POA: Diagnosis not present

## 2016-04-02 DIAGNOSIS — I358 Other nonrheumatic aortic valve disorders: Secondary | ICD-10-CM | POA: Diagnosis not present

## 2016-04-09 DIAGNOSIS — M6281 Muscle weakness (generalized): Secondary | ICD-10-CM | POA: Diagnosis not present

## 2016-04-09 DIAGNOSIS — S32000S Wedge compression fracture of unspecified lumbar vertebra, sequela: Secondary | ICD-10-CM | POA: Diagnosis not present

## 2016-04-09 DIAGNOSIS — I10 Essential (primary) hypertension: Secondary | ICD-10-CM | POA: Diagnosis not present

## 2016-04-09 DIAGNOSIS — R296 Repeated falls: Secondary | ICD-10-CM | POA: Diagnosis not present

## 2016-04-09 DIAGNOSIS — R2689 Other abnormalities of gait and mobility: Secondary | ICD-10-CM | POA: Diagnosis not present

## 2016-04-16 DIAGNOSIS — M79671 Pain in right foot: Secondary | ICD-10-CM | POA: Diagnosis not present

## 2016-04-17 DIAGNOSIS — Z79899 Other long term (current) drug therapy: Secondary | ICD-10-CM | POA: Diagnosis not present

## 2016-04-23 DIAGNOSIS — G894 Chronic pain syndrome: Secondary | ICD-10-CM | POA: Diagnosis not present

## 2016-04-29 DIAGNOSIS — E782 Mixed hyperlipidemia: Secondary | ICD-10-CM | POA: Diagnosis not present

## 2016-04-29 DIAGNOSIS — I4891 Unspecified atrial fibrillation: Secondary | ICD-10-CM | POA: Diagnosis not present

## 2016-04-29 DIAGNOSIS — D649 Anemia, unspecified: Secondary | ICD-10-CM | POA: Diagnosis not present

## 2016-04-29 DIAGNOSIS — I358 Other nonrheumatic aortic valve disorders: Secondary | ICD-10-CM | POA: Diagnosis not present

## 2016-05-07 DIAGNOSIS — I10 Essential (primary) hypertension: Secondary | ICD-10-CM | POA: Diagnosis not present

## 2016-05-07 DIAGNOSIS — M6281 Muscle weakness (generalized): Secondary | ICD-10-CM | POA: Diagnosis not present

## 2016-05-07 DIAGNOSIS — R296 Repeated falls: Secondary | ICD-10-CM | POA: Diagnosis not present

## 2016-05-07 DIAGNOSIS — Z79899 Other long term (current) drug therapy: Secondary | ICD-10-CM | POA: Diagnosis not present

## 2016-05-07 DIAGNOSIS — S32000S Wedge compression fracture of unspecified lumbar vertebra, sequela: Secondary | ICD-10-CM | POA: Diagnosis not present

## 2016-05-07 DIAGNOSIS — D649 Anemia, unspecified: Secondary | ICD-10-CM | POA: Diagnosis not present

## 2016-05-19 DIAGNOSIS — I10 Essential (primary) hypertension: Secondary | ICD-10-CM | POA: Diagnosis not present

## 2016-05-19 DIAGNOSIS — M6281 Muscle weakness (generalized): Secondary | ICD-10-CM | POA: Diagnosis not present

## 2016-05-19 DIAGNOSIS — D649 Anemia, unspecified: Secondary | ICD-10-CM | POA: Diagnosis not present

## 2016-05-19 DIAGNOSIS — S32000S Wedge compression fracture of unspecified lumbar vertebra, sequela: Secondary | ICD-10-CM | POA: Diagnosis not present

## 2016-05-19 DIAGNOSIS — R296 Repeated falls: Secondary | ICD-10-CM | POA: Diagnosis not present

## 2016-05-30 DIAGNOSIS — I358 Other nonrheumatic aortic valve disorders: Secondary | ICD-10-CM | POA: Diagnosis not present

## 2016-05-30 DIAGNOSIS — I4891 Unspecified atrial fibrillation: Secondary | ICD-10-CM | POA: Diagnosis not present

## 2016-05-30 DIAGNOSIS — D649 Anemia, unspecified: Secondary | ICD-10-CM | POA: Diagnosis not present

## 2016-05-30 DIAGNOSIS — E782 Mixed hyperlipidemia: Secondary | ICD-10-CM | POA: Diagnosis not present

## 2016-06-04 DIAGNOSIS — S32000S Wedge compression fracture of unspecified lumbar vertebra, sequela: Secondary | ICD-10-CM | POA: Diagnosis not present

## 2016-06-04 DIAGNOSIS — D649 Anemia, unspecified: Secondary | ICD-10-CM | POA: Diagnosis not present

## 2016-06-04 DIAGNOSIS — I10 Essential (primary) hypertension: Secondary | ICD-10-CM | POA: Diagnosis not present

## 2016-06-04 DIAGNOSIS — R296 Repeated falls: Secondary | ICD-10-CM | POA: Diagnosis not present

## 2016-06-04 DIAGNOSIS — M6281 Muscle weakness (generalized): Secondary | ICD-10-CM | POA: Diagnosis not present

## 2016-06-18 DIAGNOSIS — N183 Chronic kidney disease, stage 3 (moderate): Secondary | ICD-10-CM | POA: Diagnosis not present

## 2016-06-18 DIAGNOSIS — D649 Anemia, unspecified: Secondary | ICD-10-CM | POA: Diagnosis not present

## 2016-06-18 DIAGNOSIS — E782 Mixed hyperlipidemia: Secondary | ICD-10-CM | POA: Diagnosis not present

## 2016-06-18 DIAGNOSIS — I10 Essential (primary) hypertension: Secondary | ICD-10-CM | POA: Diagnosis not present

## 2016-06-29 DIAGNOSIS — E782 Mixed hyperlipidemia: Secondary | ICD-10-CM | POA: Diagnosis not present

## 2016-06-29 DIAGNOSIS — I358 Other nonrheumatic aortic valve disorders: Secondary | ICD-10-CM | POA: Diagnosis not present

## 2016-06-29 DIAGNOSIS — I4891 Unspecified atrial fibrillation: Secondary | ICD-10-CM | POA: Diagnosis not present

## 2016-06-29 DIAGNOSIS — D649 Anemia, unspecified: Secondary | ICD-10-CM | POA: Diagnosis not present

## 2016-07-16 DIAGNOSIS — H04123 Dry eye syndrome of bilateral lacrimal glands: Secondary | ICD-10-CM | POA: Diagnosis not present

## 2016-07-16 DIAGNOSIS — I10 Essential (primary) hypertension: Secondary | ICD-10-CM | POA: Diagnosis not present

## 2016-07-16 DIAGNOSIS — M6281 Muscle weakness (generalized): Secondary | ICD-10-CM | POA: Diagnosis not present

## 2016-07-30 DIAGNOSIS — E782 Mixed hyperlipidemia: Secondary | ICD-10-CM | POA: Diagnosis not present

## 2016-07-30 DIAGNOSIS — I358 Other nonrheumatic aortic valve disorders: Secondary | ICD-10-CM | POA: Diagnosis not present

## 2016-07-30 DIAGNOSIS — D649 Anemia, unspecified: Secondary | ICD-10-CM | POA: Diagnosis not present

## 2016-07-30 DIAGNOSIS — I4891 Unspecified atrial fibrillation: Secondary | ICD-10-CM | POA: Diagnosis not present

## 2016-08-13 DIAGNOSIS — R296 Repeated falls: Secondary | ICD-10-CM | POA: Diagnosis not present

## 2016-08-13 DIAGNOSIS — B351 Tinea unguium: Secondary | ICD-10-CM | POA: Diagnosis not present

## 2016-08-13 DIAGNOSIS — Q845 Enlarged and hypertrophic nails: Secondary | ICD-10-CM | POA: Diagnosis not present

## 2016-08-28 DIAGNOSIS — I358 Other nonrheumatic aortic valve disorders: Secondary | ICD-10-CM | POA: Diagnosis not present

## 2016-08-28 DIAGNOSIS — E782 Mixed hyperlipidemia: Secondary | ICD-10-CM | POA: Diagnosis not present

## 2016-08-28 DIAGNOSIS — I4891 Unspecified atrial fibrillation: Secondary | ICD-10-CM | POA: Diagnosis not present

## 2016-08-28 DIAGNOSIS — D649 Anemia, unspecified: Secondary | ICD-10-CM | POA: Diagnosis not present

## 2016-09-08 DIAGNOSIS — Q845 Enlarged and hypertrophic nails: Secondary | ICD-10-CM | POA: Diagnosis not present

## 2016-09-08 DIAGNOSIS — B351 Tinea unguium: Secondary | ICD-10-CM | POA: Diagnosis not present

## 2016-09-08 DIAGNOSIS — R296 Repeated falls: Secondary | ICD-10-CM | POA: Diagnosis not present

## 2016-09-08 DIAGNOSIS — I10 Essential (primary) hypertension: Secondary | ICD-10-CM | POA: Diagnosis not present

## 2016-09-10 DIAGNOSIS — D649 Anemia, unspecified: Secondary | ICD-10-CM | POA: Diagnosis not present

## 2016-09-10 DIAGNOSIS — I10 Essential (primary) hypertension: Secondary | ICD-10-CM | POA: Diagnosis not present

## 2016-09-10 DIAGNOSIS — H04123 Dry eye syndrome of bilateral lacrimal glands: Secondary | ICD-10-CM | POA: Diagnosis not present

## 2016-09-17 DIAGNOSIS — N183 Chronic kidney disease, stage 3 (moderate): Secondary | ICD-10-CM | POA: Diagnosis not present

## 2016-09-17 DIAGNOSIS — I358 Other nonrheumatic aortic valve disorders: Secondary | ICD-10-CM | POA: Diagnosis not present

## 2016-09-17 DIAGNOSIS — D649 Anemia, unspecified: Secondary | ICD-10-CM | POA: Diagnosis not present

## 2016-09-17 DIAGNOSIS — I10 Essential (primary) hypertension: Secondary | ICD-10-CM | POA: Diagnosis not present

## 2016-09-30 DIAGNOSIS — I358 Other nonrheumatic aortic valve disorders: Secondary | ICD-10-CM | POA: Diagnosis not present

## 2016-09-30 DIAGNOSIS — I4891 Unspecified atrial fibrillation: Secondary | ICD-10-CM | POA: Diagnosis not present

## 2016-09-30 DIAGNOSIS — D649 Anemia, unspecified: Secondary | ICD-10-CM | POA: Diagnosis not present

## 2016-09-30 DIAGNOSIS — E782 Mixed hyperlipidemia: Secondary | ICD-10-CM | POA: Diagnosis not present

## 2016-10-08 DIAGNOSIS — L89609 Pressure ulcer of unspecified heel, unspecified stage: Secondary | ICD-10-CM | POA: Diagnosis not present

## 2016-10-08 DIAGNOSIS — M159 Polyosteoarthritis, unspecified: Secondary | ICD-10-CM | POA: Diagnosis not present

## 2016-10-21 ENCOUNTER — Emergency Department (HOSPITAL_BASED_OUTPATIENT_CLINIC_OR_DEPARTMENT_OTHER): Payer: Medicare Other

## 2016-10-21 ENCOUNTER — Encounter (HOSPITAL_BASED_OUTPATIENT_CLINIC_OR_DEPARTMENT_OTHER): Payer: Self-pay | Admitting: *Deleted

## 2016-10-21 ENCOUNTER — Inpatient Hospital Stay (HOSPITAL_BASED_OUTPATIENT_CLINIC_OR_DEPARTMENT_OTHER)
Admission: EM | Admit: 2016-10-21 | Discharge: 2016-10-25 | DRG: 683 | Disposition: A | Discharge status: Skilled Nursing Facility | Payer: Medicare Other | Attending: Family Medicine | Admitting: Family Medicine

## 2016-10-21 DIAGNOSIS — S79912A Unspecified injury of left hip, initial encounter: Secondary | ICD-10-CM | POA: Diagnosis not present

## 2016-10-21 DIAGNOSIS — S22080A Wedge compression fracture of T11-T12 vertebra, initial encounter for closed fracture: Secondary | ICD-10-CM | POA: Diagnosis not present

## 2016-10-21 DIAGNOSIS — Z7901 Long term (current) use of anticoagulants: Secondary | ICD-10-CM | POA: Diagnosis not present

## 2016-10-21 DIAGNOSIS — S3992XA Unspecified injury of lower back, initial encounter: Secondary | ICD-10-CM | POA: Diagnosis not present

## 2016-10-21 DIAGNOSIS — Z6823 Body mass index (BMI) 23.0-23.9, adult: Secondary | ICD-10-CM | POA: Diagnosis not present

## 2016-10-21 DIAGNOSIS — M25552 Pain in left hip: Secondary | ICD-10-CM | POA: Diagnosis not present

## 2016-10-21 DIAGNOSIS — S79911A Unspecified injury of right hip, initial encounter: Secondary | ICD-10-CM | POA: Diagnosis not present

## 2016-10-21 DIAGNOSIS — I129 Hypertensive chronic kidney disease with stage 1 through stage 4 chronic kidney disease, or unspecified chronic kidney disease: Secondary | ICD-10-CM | POA: Diagnosis not present

## 2016-10-21 DIAGNOSIS — R102 Pelvic and perineal pain: Secondary | ICD-10-CM | POA: Diagnosis not present

## 2016-10-21 DIAGNOSIS — M4854XA Collapsed vertebra, not elsewhere classified, thoracic region, initial encounter for fracture: Secondary | ICD-10-CM | POA: Diagnosis present

## 2016-10-21 DIAGNOSIS — W19XXXA Unspecified fall, initial encounter: Secondary | ICD-10-CM | POA: Diagnosis present

## 2016-10-21 DIAGNOSIS — F0390 Unspecified dementia without behavioral disturbance: Secondary | ICD-10-CM | POA: Diagnosis not present

## 2016-10-21 DIAGNOSIS — M79641 Pain in right hand: Secondary | ICD-10-CM | POA: Diagnosis not present

## 2016-10-21 DIAGNOSIS — E440 Moderate protein-calorie malnutrition: Secondary | ICD-10-CM | POA: Diagnosis not present

## 2016-10-21 DIAGNOSIS — S3993XA Unspecified injury of pelvis, initial encounter: Secondary | ICD-10-CM | POA: Diagnosis not present

## 2016-10-21 DIAGNOSIS — S6991XA Unspecified injury of right wrist, hand and finger(s), initial encounter: Secondary | ICD-10-CM | POA: Diagnosis not present

## 2016-10-21 DIAGNOSIS — N390 Urinary tract infection, site not specified: Secondary | ICD-10-CM | POA: Diagnosis present

## 2016-10-21 DIAGNOSIS — N179 Acute kidney failure, unspecified: Secondary | ICD-10-CM | POA: Diagnosis present

## 2016-10-21 DIAGNOSIS — N183 Chronic kidney disease, stage 3 unspecified: Secondary | ICD-10-CM | POA: Diagnosis present

## 2016-10-21 DIAGNOSIS — Z66 Do not resuscitate: Secondary | ICD-10-CM | POA: Diagnosis present

## 2016-10-21 DIAGNOSIS — M545 Low back pain: Secondary | ICD-10-CM | POA: Diagnosis not present

## 2016-10-21 DIAGNOSIS — S0990XA Unspecified injury of head, initial encounter: Secondary | ICD-10-CM | POA: Diagnosis not present

## 2016-10-21 DIAGNOSIS — E785 Hyperlipidemia, unspecified: Secondary | ICD-10-CM | POA: Diagnosis not present

## 2016-10-21 DIAGNOSIS — I4891 Unspecified atrial fibrillation: Secondary | ICD-10-CM | POA: Diagnosis not present

## 2016-10-21 DIAGNOSIS — S299XXA Unspecified injury of thorax, initial encounter: Secondary | ICD-10-CM | POA: Diagnosis not present

## 2016-10-21 DIAGNOSIS — M25551 Pain in right hip: Secondary | ICD-10-CM | POA: Diagnosis not present

## 2016-10-21 HISTORY — DX: Unspecified atrial fibrillation: I48.91

## 2016-10-21 HISTORY — DX: Disorder of kidney and ureter, unspecified: N28.9

## 2016-10-21 HISTORY — DX: Major depressive disorder, single episode, unspecified: F32.9

## 2016-10-21 HISTORY — DX: Depression, unspecified: F32.A

## 2016-10-21 HISTORY — DX: Unspecified dementia, unspecified severity, without behavioral disturbance, psychotic disturbance, mood disturbance, and anxiety: F03.90

## 2016-10-21 HISTORY — DX: Anemia, unspecified: D64.9

## 2016-10-21 LAB — URINALYSIS, ROUTINE W REFLEX MICROSCOPIC
Bilirubin Urine: NEGATIVE
Glucose, UA: NEGATIVE mg/dL
HGB URINE DIPSTICK: NEGATIVE
Ketones, ur: NEGATIVE mg/dL
NITRITE: POSITIVE — AB
PROTEIN: NEGATIVE mg/dL
SPECIFIC GRAVITY, URINE: 1.011 (ref 1.005–1.030)
pH: 7 (ref 5.0–8.0)

## 2016-10-21 LAB — CBC WITH DIFFERENTIAL/PLATELET
BASOS ABS: 0 10*3/uL (ref 0.0–0.1)
BASOS PCT: 0 %
Eosinophils Absolute: 0.1 10*3/uL (ref 0.0–0.7)
Eosinophils Relative: 3 %
HEMATOCRIT: 29.8 % — AB (ref 36.0–46.0)
Hemoglobin: 10.2 g/dL — ABNORMAL LOW (ref 12.0–15.0)
Lymphocytes Relative: 51 %
Lymphs Abs: 2.5 10*3/uL (ref 0.7–4.0)
MCH: 33.6 pg (ref 26.0–34.0)
MCHC: 34.2 g/dL (ref 30.0–36.0)
MCV: 98 fL (ref 78.0–100.0)
MONOS PCT: 8 %
Monocytes Absolute: 0.4 10*3/uL (ref 0.1–1.0)
NEUTROS ABS: 1.9 10*3/uL (ref 1.7–7.7)
NEUTROS PCT: 38 %
Platelets: 177 10*3/uL (ref 150–400)
RBC: 3.04 MIL/uL — AB (ref 3.87–5.11)
RDW: 11.7 % (ref 11.5–15.5)
WBC: 4.9 10*3/uL (ref 4.0–10.5)

## 2016-10-21 LAB — URINALYSIS, MICROSCOPIC (REFLEX)

## 2016-10-21 LAB — BASIC METABOLIC PANEL
ANION GAP: 8 (ref 5–15)
BUN: 16 mg/dL (ref 6–20)
CHLORIDE: 103 mmol/L (ref 101–111)
CO2: 27 mmol/L (ref 22–32)
CREATININE: 1.73 mg/dL — AB (ref 0.44–1.00)
Calcium: 9.6 mg/dL (ref 8.9–10.3)
GFR calc non Af Amer: 23 mL/min — ABNORMAL LOW (ref >60)
GFR, EST AFRICAN AMERICAN: 27 mL/min — AB (ref >60)
Glucose, Bld: 87 mg/dL (ref 65–99)
POTASSIUM: 4.2 mmol/L (ref 3.5–5.1)
SODIUM: 138 mmol/L (ref 135–145)

## 2016-10-21 LAB — CK: CK TOTAL: 31 U/L — AB (ref 38–234)

## 2016-10-21 MED ORDER — ACETAMINOPHEN 325 MG PO TABS: 650.0000 mg | ORAL_TABLET | Freq: Four times a day (QID) | ORAL | Status: DC | PRN

## 2016-10-21 MED ORDER — DOCUSATE SODIUM 100 MG PO CAPS
100.0000 mg | ORAL_CAPSULE | Freq: Two times a day (BID) | ORAL | Status: DC
  Administered 2016-10-22 – 2016-10-24 (×6): 100 mg via ORAL
  Filled 2016-10-21 (×8): qty 1

## 2016-10-21 MED ORDER — AMLODIPINE BESYLATE 5 MG PO TABS
2.5000 mg | ORAL_TABLET | Freq: Two times a day (BID) | ORAL | Status: DC
  Administered 2016-10-21 – 2016-10-24 (×7): 2.5 mg via ORAL
  Filled 2016-10-21 (×8): qty 1

## 2016-10-21 MED ORDER — POLYETHYLENE GLYCOL 3350 17 G PO PACK
17.0000 g | PACK | Freq: Every day | ORAL | Status: DC
  Administered 2016-10-22 – 2016-10-25 (×4): 17 g via ORAL
  Filled 2016-10-21 (×4): qty 1

## 2016-10-21 MED ORDER — POLYVINYL ALCOHOL 1.4 % OP SOLN
1.0000 [drp] | Freq: Three times a day (TID) | OPHTHALMIC | Status: DC
  Administered 2016-10-21 – 2016-10-25 (×10): 1 [drp] via OPHTHALMIC
  Filled 2016-10-21: qty 15

## 2016-10-21 MED ORDER — LORAZEPAM 0.5 MG PO TABS: 0.5000 mg | ORAL_TABLET | Freq: Two times a day (BID) | ORAL | Status: DC | PRN

## 2016-10-21 MED ORDER — DEXTROSE 5 % IV SOLN
1.0000 g | Freq: Once | INTRAVENOUS | Status: AC
  Administered 2016-10-21: 1 g via INTRAVENOUS
  Filled 2016-10-21: qty 10

## 2016-10-21 MED ORDER — TAB-A-VITE/IRON PO TABS
1.0000 | ORAL_TABLET | Freq: Every day | ORAL | Status: DC
  Administered 2016-10-22 – 2016-10-25 (×4): 1 via ORAL
  Filled 2016-10-21 (×5): qty 1

## 2016-10-21 MED ORDER — HYDROCODONE-ACETAMINOPHEN 5-325 MG PO TABS: 1.0000 | ORAL_TABLET | Freq: Four times a day (QID) | ORAL | Status: DC | PRN

## 2016-10-21 MED ORDER — ENOXAPARIN SODIUM 30 MG/0.3ML ~~LOC~~ SOLN
30.0000 mg | SUBCUTANEOUS | Status: DC
  Administered 2016-10-21 – 2016-10-24 (×4): 30 mg via SUBCUTANEOUS
  Filled 2016-10-21 (×4): qty 0.3

## 2016-10-21 MED ORDER — SODIUM CHLORIDE 0.9 % IV SOLN
INTRAVENOUS | Status: DC
  Administered 2016-10-21 – 2016-10-22 (×3): via INTRAVENOUS

## 2016-10-21 MED ORDER — DONEPEZIL HCL 5 MG PO TABS: 5.0000 mg | ORAL_TABLET | Freq: Every day | ORAL | Status: DC

## 2016-10-21 MED ORDER — DIVALPROEX SODIUM 250 MG PO DR TAB
250.0000 mg | DELAYED_RELEASE_TABLET | Freq: Two times a day (BID) | ORAL | Status: DC
  Administered 2016-10-21: 250 mg via ORAL
  Filled 2016-10-21 (×2): qty 1

## 2016-10-21 MED ORDER — DEXTROSE 5 % IV SOLN
1.0000 g | INTRAVENOUS | Status: DC
  Administered 2016-10-22: 1 g via INTRAVENOUS
  Filled 2016-10-21 (×2): qty 10

## 2016-10-21 MED ORDER — ACETAMINOPHEN 650 MG RE SUPP: 650.0000 mg | Freq: Four times a day (QID) | RECTAL | Status: DC | PRN

## 2016-10-21 MED ORDER — QUETIAPINE FUMARATE 100 MG PO TABS
100.0000 mg | ORAL_TABLET | Freq: Every day | ORAL | Status: DC
  Administered 2016-10-21 – 2016-10-24 (×4): 100 mg via ORAL
  Filled 2016-10-21 (×4): qty 1

## 2016-10-21 MED ORDER — ACETAMINOPHEN 500 MG PO TABS
500.0000 mg | ORAL_TABLET | Freq: Two times a day (BID) | ORAL | Status: DC
  Administered 2016-10-21 – 2016-10-25 (×8): 500 mg via ORAL
  Filled 2016-10-21 (×9): qty 1

## 2016-10-21 NOTE — ED Provider Notes (Signed)
[facility] Provider [facility]   CSN: [id number] Arrival date & time: 10/21/16  1016     History   Chief Complaint Chief Complaint  Patient presents with  . Fall    HPI Heather L Cassar is a 83 y.o. female.  HPI   83 year old female with history of dementia here with generalized weakness, back pain, and fall. The history is limited due to patient's dementia and the patient's caregiver was with her throughout the day yesterday is not present. However, she arrives with her power of attorney and neighbor. The patient's neighbor states that she has been mildly weak for the last 24 hours. She reportedly fell while trying to walk yesterday. This is abnormal for her and she is normally able to get around with a walker. Since then, she has had difficulty walking, and is complained of back as well as hip pain. She also appears to have right hand pain. On my assessment, the patient declines complaints. She does state she feels tired but denies any other complaints.  Level 5 caveat invoked as remainder of history, ROS, and physical exam limited due to patient's dementia.   Past Medical History:  Diagnosis Date  . Anemia   . Atrial fibrillation (HCC)   . Dementia   . Depression   . Hypertension   . Renal disorder     Patient Active Problem List   Diagnosis Date Noted  . ARF (acute renal failure) (HCC) 10/21/2016  . Hyperlipidemia 06/24/2011  . Chronic renal insufficiency 06/24/2011  . Aortic valve stenosis 06/24/2011  . Atrial fibrillation (HCC) 04/11/2011  . Anemia 04/11/2011    History reviewed. No pertinent surgical history.  OB History    No data available       Home Medications    Prior to Admission medications   Medication Sig Start Date End Date Taking? Authorizing Provider  acetaminophen (TYLENOL) 500 MG tablet Take 500 mg by mouth 2 (two) times daily.    [provider]  amLODipine (NORVASC) 2.5 MG tablet Take 2.5 mg by mouth 2 (two) times  daily.    [provider]  clindamycin (CLEOCIN) 300 MG capsule Take 600 mg by mouth daily as needed (one hour prior to denatl appointment).    [provider]  divalproex (DEPAKOTE) 250 MG DR tablet Take 250 mg by mouth 2 (two) times daily.    [provider]  donepezil (ARICEPT) 5 MG tablet Take 5 mg by mouth at bedtime.    [provider]  HYDROcodone-acetaminophen (NORCO/VICODIN) 5-325 MG tablet Take 1-2 tablets by mouth every 6 (six) hours as needed for severe pain. 12/28/15   Little, Ambrose Finland, MD  LORazepam (ATIVAN) 0.5 MG tablet Take 0.5 mg by mouth every 12 (twelve) hours as needed for anxiety.     [provider]  Multiple Vitamins-Iron (MULTIVITAMINS WITH IRON) TABS tablet Take 1 tablet by mouth daily.    [provider]  polyethylene glycol (MIRALAX / GLYCOLAX) packet Take 17 g by mouth daily.    [provider]  polyvinyl alcohol (LIQUIFILM TEARS) 1.4 % ophthalmic solution Place 1 drop into both eyes 3 (three) times daily.    [provider]  QUEtiapine (SEROQUEL) 100 MG tablet Take 100 mg by mouth at bedtime.    [provider]    Family History History reviewed. No pertinent family history.  Social History Social History  Substance Use Topics  . Smoking status: Never Smoker  . Smokeless tobacco: Never Used  .  Alcohol use No     Allergies   Patient has no known allergies.   Review of Systems Review of Systems  Unable to perform ROS: Mental status change     Physical Exam Updated Vital Signs BP 94/78 (BP Location: Left Arm)   Pulse 76   Temp 97.8 F (36.6 C) (Oral)   Resp 16   SpO2 97%   Physical Exam  Constitutional: She is oriented to person, place, and time. She appears well-developed and well-nourished. No distress.  HENT:  Head: Normocephalic and atraumatic.  Mildly dry mucous membranes  Eyes: Conjunctivae are normal.  Neck: Neck supple.  Cardiovascular: Normal  rate, regular rhythm and normal heart sounds.  Exam reveals no friction rub.   No murmur heard. Pulmonary/Chest: Effort normal and breath sounds normal. No respiratory distress. She has no wheezes. She has no rales.  Abdominal: Soft. She exhibits no distension.  Musculoskeletal: She exhibits tenderness. She exhibits no edema.  Tenderness to palpation over left greater trochanter and anterior pelvis. No apparent deformity or laxity. The patient appears to be holding her right arm, with mild tenderness over the right thumb and dorsum of hand. No bruising. No significant midline tenderness throughout the C, T, or L-spine. Strength 5 out of 5 in bilateral lower extremities.  Neurological: She is alert and oriented to person, place, and time. She exhibits normal muscle tone.  Skin: Skin is warm. Capillary refill takes less than 2 seconds.  Psychiatric: She has a normal mood and affect.  Nursing note and vitals reviewed.    ED Treatments / Results  Labs (all labs ordered are listed, but only abnormal results are displayed) Labs Reviewed  CBC WITH DIFFERENTIAL/PLATELET - Abnormal; Notable for the following:       Result Value   RBC 3.04 (*)    Hemoglobin 10.2 (*)    HCT 29.8 (*)    All other components within normal limits  BASIC METABOLIC PANEL - Abnormal; Notable for the following:    Creatinine, Ser 1.73 (*)    GFR calc non Af Amer 23 (*)    GFR calc Af Amer 27 (*)    All other components within normal limits  CK - Abnormal; Notable for the following:    Total CK 31 (*)    All other components within normal limits  URINALYSIS, ROUTINE W REFLEX MICROSCOPIC - Abnormal; Notable for the following:    APPearance CLOUDY (*)    Nitrite POSITIVE (*)    Leukocytes, UA LARGE (*)    All other components within normal limits  URINALYSIS, MICROSCOPIC (REFLEX) - Abnormal; Notable for the following:    Bacteria, UA MANY (*)    Squamous Epithelial / LPF 0-5 (*)    All other components within  normal limits  URINE CULTURE    EKG  EKG Interpretation None       Radiology Dg Chest 2 View  Result Date: 10/21/2016 CLINICAL DATA:  Fall EXAM: CHEST  2 VIEW COMPARISON:  03/21/2016 FINDINGS: Lungs are clear.  No pleural effusion or pneumothorax. The heart is normal in size. Stable mild superior endplate compression fracture deformity involving a lower thoracic vertebral body. IMPRESSION: No evidence of acute cardiopulmonary disease. Electronically Signed   By: Charline Bills M.D.   On: 10/21/2016 12:12   Dg Lumbar Spine Complete  Result Date: 10/21/2016 CLINICAL DATA:  Fall with low back pain. EXAM: LUMBAR SPINE - COMPLETE 4+ VIEW COMPARISON:  12/28/2015 FINDINGS: Bones are diffusely demineralized. Demineralization hinders assessment.  Lateral film again shows compression deformity at the level of T11 and mild anterior wedge deformity of L5, stable in the interval. Grade 2 anterolisthesis of L4 on 5 is unchanged. SI joints are unremarkable. Dystrophic calcification over the central pelvis compatible with uterine fibroids. IMPRESSION: Stable exam. Compression deformity seen at T11 and L5 with grade 2 anterolisthesis of L4 on 5. No definite acute fracture on today's study. Electronically Signed   By: Kennith Center M.D.   On: 10/21/2016 12:10   Ct Head Wo Contrast  Result Date: 10/21/2016 CLINICAL DATA:  Recent fall. EXAM: CT HEAD WITHOUT CONTRAST TECHNIQUE: Contiguous axial images were obtained from the base of the skull through the vertex without intravenous contrast. COMPARISON:  03/20/2015 FINDINGS: Brain: No evidence for acute brain infarct, intracranial hemorrhage or mass. Mass effect or midline shift identified. There is low attenuation within the subcortical and periventricular white matter compatible with chronic small vessel ischemic disease. Prominence of the sulci and ventricles noted compatible with brain atrophy. Vascular: No hyperdense vessel or unexpected calcification. Skull:   The calvarium appears intact. Sinuses/Orbits: Mild mucosal thickening involving the left maxillary sinus measures 2 mm. There is also mild mucosal thickening involving the sphenoid sinus measuring 4 mm. Other: None. IMPRESSION: 1. No acute intracranial abnormalities. 2. Chronic small vessel ischemic change and brain atrophy. 3. Mild mucosal thickening involving the paranasal sinuses. Electronically Signed   By: Signa Kell M.D.   On: 10/21/2016 12:56   Ct Lumbar Spine Wo Contrast  Result Date: 10/21/2016 CLINICAL DATA:  83 year old female with low back pain after fall. Chronic T11 compression fracture and L4-L5 spondylolisthesis. EXAM: CT LUMBAR SPINE WITHOUT CONTRAST TECHNIQUE: Multidetector CT imaging of the lumbar spine was performed without intravenous contrast administration. Multiplanar CT image reconstructions were also generated. COMPARISON:  Lumbar radiographs 1133 hours today. Thoracic spine CT and lumbar radiographs 12/28/2015 FINDINGS: Segmentation: Normal. Alignment: Stable since 2017, with grade 2 spondylolisthesis of L4 on L5 (14-15 mm of spondylolisthesis). Associated chronic L4 spondylolisthesis. Mild dextroconvex curvature of the lumbar spine. Vertebrae: Osteopenia. Moderate to severe T11 compression fracture is stable since the 2017 CT. New mild T12 superior endplate compression since that time, less than 20% loss of vertebral body height. No significant bony retropulsion. The T12 pedicles and posterior elements appear intact. L1 superior and L2 inferior endplate deformities compatible with prominent Schmorl nodes appear stable from the prior CT. Mild concavity of the L3, L4, and L5 endplates appears stable from the 2017 lumbar radiographs. The S1 sacral level is only partially included but appears intact. Paraspinal and other soft tissues: Aortoiliac calcified atherosclerosis. Retained stool throughout the colon. Otherwise negative visualized noncontrast abdominal viscera. Negative  visualized posterior paraspinal soft tissues. Disc levels: Chronic grade 2 spondylolisthesis at L4-L5 with chronic L4 pars fractures and posterior element hypertrophy results in chronic moderate to severe spinal stenosis at that level. Elsewhere the lumbar spinal canal is capacious. IMPRESSION: 1. Mild T12 superior endplate compression fracture is new since 12/28/2015 and suspicious for acute injury in this clinical setting. If specific therapy such as vertebroplasty is desired, Lumbar MRI or Nuclear Medicine Whole-body Bone Scan would best confirm acuity. 2. No other acute osseous abnormality identified in lumbar spine. Osteopenia. Chronic T11 compression fracture. 3. L4-L5 chronic grade 2 spondylolisthesis and spondylolysis with moderate to severe spinal stenosis. 4.  Aortic Atherosclerosis (ICD10-I70.0). Electronically Signed   By: Odessa Fleming M.D.   On: 10/21/2016 13:02   Ct Pelvis Wo Contrast  Result Date: 10/21/2016 CLINICAL DATA:  Fell.  Pelvic pain. EXAM: CT PELVIS WITHOUT CONTRAST TECHNIQUE: Multidetector CT imaging of the pelvis was performed following the standard protocol without intravenous contrast. COMPARISON:  None. FINDINGS: Urinary Tract: The bladder is moderately distended. No calculi are identified. No obvious mass. Bowel:  The visualize colon and small bowel appear grossly normal. Vascular/Lymphatic: Advanced vascular calcifications but no aneurysm. No pelvic lymphadenopathy. Reproductive: Retroverted uterus with numerous calcified fibroids. Both ovaries appear normal. Other: No all pelvic mass, free pelvic fluid collections or inguinal adenopathy. Musculoskeletal: Bilateral pars defects are noted at L4 with a grade 2 spondylolisthesis. No pelvic or hip fractures are identified. The pubic symphysis and SI joints are intact. Mild degenerative changes. IMPRESSION: No acute pelvic or hip fractures are identified. No acute intrapelvic findings are identified. Moderate bladder distention. Enlarged  uterus with numerous calcified fibroids. Electronically Signed   By: Rudie Meyer M.D.   On: 10/21/2016 13:00   Dg Hand Complete Right  Result Date: 10/21/2016 CLINICAL DATA:  Status post fall.  Hand pain. EXAM: RIGHT HAND - COMPLETE 3+ VIEW COMPARISON:  None. FINDINGS: Severe generalized osteopenia. Severe osteoarthritis of the second, third and fourth DIP joints. Mild osteoarthritis of the fifth DIP joint. Mild osteoarthritis of the first [facility] joint, scaphotrapeziotrapezoid joint and first MCP joint. IMPRESSION: No acute osseous injury of the right hand. Electronically Signed   By: Elige Ko   On: 10/21/2016 12:06   Dg Hips Bilat With Pelvis 3-4 Views  Result Date: 10/21/2016 CLINICAL DATA:  Fall.  Hip pain. EXAM: DG HIP (WITH OR WITHOUT PELVIS) 3-4V BILAT COMPARISON:  12/28/2015. FINDINGS: Degenerative changes lumbar spine and both hips. Diffuse osteopenia. No acute bony or joint abnormality identified. No evidence of fracture dislocation. Calcified pelvic densities consistent fibroids. Peripheral vascular calcification. IMPRESSION: Diffuse osteopenia degenerative change.  No acute abnormality. 2.  Calcified fibroids. 3.  Peripheral vascular disease. Electronically Signed   By: Maisie Fus  Register   On: 10/21/2016 12:08    Procedures Procedures (including critical care time)  Medications Ordered in ED Medications  0.9 %  sodium chloride infusion ( Intravenous Transfusing/Transfer 10/21/16 1559)  cefTRIAXone (ROCEPHIN) 1 g in dextrose 5 % 50 mL IVPB (0 g Intravenous Stopped 10/21/16 1552)     Initial Impression / Assessment and Plan / ED Course  I have reviewed the triage vital signs and the nursing notes.  Pertinent labs & imaging results that were available during my care of the patient were reviewed by me and considered in my medical decision making (see chart for details).     83 year old female here with generalized weakness and fall. Exam concern for possible hip, pelvis injury but  imaging and CT scans are negative. The patient does have an age indeterminate thoracic spine fracture, but is not specifically tender here and family/POA confirms that she has a history of similar injury. This may be contributing to her pain but do not suspect acute intervention is needed. Otherwise, she does have a UTI and appears mildly dehydrated. Will place on Rocephin, fluids, and admit  This note was prepared with assistance of Dragon voice recognition software. Occasional wrong-word or sound-a-like substitutions may have occurred due to the inherent limitations of voice recognition software.   Final Clinical Impressions(s) / ED Diagnoses   Final diagnoses:  Fall  AKI (acute kidney injury) (HCC)  Lower urinary tract infectious disease    New Prescriptions New Prescriptions   No medications on file     Shaune Pollack, MD 10/21/16 [phone]

## 2016-10-21 NOTE — ED Notes (Signed)
Patient undressed and placed into a gown opened in the back with assistance from this EMT and personal sitter that came with patient. Patient covered with a blanket and is comfortable in bed.

## 2016-10-21 NOTE — H&P (Signed)
History and Physical    Crystal Rowland ZOX:[id number] DOB: 1914/07/24 DOA: 10/21/2016  PCP: Dois Davenport, MD  Patient coming from:  Home.   I have personally briefly reviewed patient's old medical records in [facility]  Chief Complaint: Fall, Weakness.   HPI: Crystal Rowland is a 83 y.o. female with medical history significant of Dementia, A fib not on anticoagulation, who presents with worsening weakness over last few days. Patient also fell the day prior to admission, unclear at this time how she fell. Patient has been complaining go dysuria , and back pain. Her urine has been dark.   POA is at bedside. She report patient has been having worsening memory problems, and some times doesn't recognized POA and or caregiver.   ED Course: Patient was found to have cr at 1.7, UA with positive nitrates and WBC 6-30.  Review of Systems: As per HPI otherwise 10 point review of systems negative.     Past Medical History:  Diagnosis Date  . Anemia   . Atrial fibrillation (HCC)   . Dementia   . Depression   . Hypertension   . Renal disorder     History reviewed. No pertinent surgical history.   reports that she has never smoked. She has never used smokeless tobacco. She reports that she does not drink alcohol or use drugs.  No Known Allergies  Family history; patient is not able to report family history   Prior to Admission medications   Medication Sig Start Date End Date Taking? Authorizing Provider  acetaminophen (TYLENOL) 500 MG tablet Take 500 mg by mouth 2 (two) times daily.    [provider]  amLODipine (NORVASC) 2.5 MG tablet Take 2.5 mg by mouth 2 (two) times daily.    [provider]  clindamycin (CLEOCIN) 300 MG capsule Take 600 mg by mouth daily as needed (one hour prior to denatl appointment).    [provider]  divalproex (DEPAKOTE) 250 MG DR tablet Take 250 mg by mouth 2 (two) times daily.    [provider]    donepezil (ARICEPT) 5 MG tablet Take 5 mg by mouth at bedtime.    [provider]  HYDROcodone-acetaminophen (NORCO/VICODIN) 5-325 MG tablet Take 1-2 tablets by mouth every 6 (six) hours as needed for severe pain. 12/28/15   Little, Ambrose Finland, MD  LORazepam (ATIVAN) 0.5 MG tablet Take 0.5 mg by mouth every 12 (twelve) hours as needed for anxiety.     [provider]  Multiple Vitamins-Iron (MULTIVITAMINS WITH IRON) TABS tablet Take 1 tablet by mouth daily.    [provider]  polyethylene glycol (MIRALAX / GLYCOLAX) packet Take 17 g by mouth daily.    [provider]  polyvinyl alcohol (LIQUIFILM TEARS) 1.4 % ophthalmic solution Place 1 drop into both eyes 3 (three) times daily.    [provider]  QUEtiapine (SEROQUEL) 100 MG tablet Take 100 mg by mouth at bedtime.    [provider]    Physical Exam: Vitals:   10/21/16 1500 10/21/16 1530 10/21/16 1554 10/21/16 1645  BP: (!) 119/56 (!) 116/59 94/78 (!) 155/57  Pulse: 71 66 76 77  Resp: 16  16 16   Temp:   97.8 F (36.6 C) 97.8 F (36.6 C)  TempSrc:   Oral Oral  SpO2: 96% 98% 97% 98%    Constitutional: NAD, calm, comfortable Vitals:   10/21/16 1500 10/21/16 1530 10/21/16 1554 10/21/16 1645  BP: (!) 119/56 (!) 116/59 94/78 Marland Kitchen)  155/57  Pulse: 71 66 76 77  Resp: 16  16 16   Temp:   97.8 F (36.6 C) 97.8 F (36.6 C)  TempSrc:   Oral Oral  SpO2: 96% 98% 97% 98%   Eyes: PERRL, lids and conjunctivae normal ENMT: Mucous membranes are moist. Posterior pharynx clear of any exudate or lesions.Normal dentition.  Neck: normal, supple, no masses, no thyromegaly Respiratory: clear to auscultation bilaterally, no wheezing, no crackles. Normal respiratory effort. No accessory muscle use.  Cardiovascular: Regular rate and rhythm, Systolic Murmur / rubs / gallops. No extremity edema. 2+ pedal pulses. No carotid bruits.  Abdomen: no tenderness, no masses palpated. No hepatosplenomegaly.  Bowel sounds positive.  Musculoskeletal: no clubbing / cyanosis.  Skin: no rashes, lesions, ulcers. No induration Neurologic: CN 2-12 grossly intact. Sensation intact, DTR normal. Generalized weakness.    Labs on Admission: I have personally reviewed following labs and imaging studies  CBC:  Recent Labs Lab 10/21/16 1200  WBC 4.9  NEUTROABS 1.9  HGB 10.2*  HCT 29.8*  MCV 98.0  PLT 177   Basic Metabolic Panel:  Recent Labs Lab 10/21/16 1200  NA 138  K 4.2  CL 103  CO2 27  GLUCOSE 87  BUN 16  CREATININE 1.73*  CALCIUM 9.6   GFR: CrCl cannot be calculated (Unknown ideal weight.). Liver Function Tests: No results for input(s): AST, ALT, ALKPHOS, BILITOT, PROT, ALBUMIN in the last 168 hours. No results for input(s): LIPASE, AMYLASE in the last 168 hours. No results for input(s): AMMONIA in the last 168 hours. Coagulation Profile: No results for input(s): INR, PROTIME in the last 168 hours. Cardiac Enzymes:  Recent Labs Lab 10/21/16 1200  CKTOTAL 31*   BNP (last 3 results) No results for input(s): PROBNP in the last 8760 hours. HbA1C: No results for input(s): HGBA1C in the last 72 hours. CBG: No results for input(s): GLUCAP in the last 168 hours. Lipid Profile: No results for input(s): CHOL, HDL, LDLCALC, TRIG, CHOLHDL, LDLDIRECT in the last 72 hours. Thyroid Function Tests: No results for input(s): TSH, T4TOTAL, FREET4, T3FREE, THYROIDAB in the last 72 hours. Anemia Panel: No results for input(s): VITAMINB12, FOLATE, FERRITIN, TIBC, IRON, RETICCTPCT in the last 72 hours. Urine analysis:    Component Value Date/Time   COLORURINE YELLOW 10/21/2016 1230   APPEARANCEUR CLOUDY (A) 10/21/2016 1230   LABSPEC 1.011 10/21/2016 1230   PHURINE 7.0 10/21/2016 1230   GLUCOSEU NEGATIVE 10/21/2016 1230   HGBUR NEGATIVE 10/21/2016 1230   BILIRUBINUR NEGATIVE 10/21/2016 1230   BILIRUBINUR negative 06/24/2011 1609   KETONESUR NEGATIVE 10/21/2016 1230   PROTEINUR  NEGATIVE 10/21/2016 1230   UROBILINOGEN 0.2 10/27/2012 1625   NITRITE POSITIVE (A) 10/21/2016 1230   LEUKOCYTESUR LARGE (A) 10/21/2016 1230    Radiological Exams on Admission: Dg Chest 2 View  Result Date: 10/21/2016 CLINICAL DATA:  Fall EXAM: CHEST  2 VIEW COMPARISON:  03/21/2016 FINDINGS: Lungs are clear.  No pleural effusion or pneumothorax. The heart is normal in size. Stable mild superior endplate compression fracture deformity involving a lower thoracic vertebral body. IMPRESSION: No evidence of acute cardiopulmonary disease. Electronically Signed   By: Charline Bills M.D.   On: 10/21/2016 12:12   Dg Lumbar Spine Complete  Result Date: 10/21/2016 CLINICAL DATA:  Fall with low back pain. EXAM: LUMBAR SPINE - COMPLETE 4+ VIEW COMPARISON:  12/28/2015 FINDINGS: Bones are diffusely demineralized. Demineralization hinders assessment. Lateral film again shows compression deformity at the level of T11 and mild anterior wedge deformity of  L5, stable in the interval. Grade 2 anterolisthesis of L4 on 5 is unchanged. SI joints are unremarkable. Dystrophic calcification over the central pelvis compatible with uterine fibroids. IMPRESSION: Stable exam. Compression deformity seen at T11 and L5 with grade 2 anterolisthesis of L4 on 5. No definite acute fracture on today's study. Electronically Signed   By: Kennith Center M.D.   On: 10/21/2016 12:10   Ct Head Wo Contrast  Result Date: 10/21/2016 CLINICAL DATA:  Recent fall. EXAM: CT HEAD WITHOUT CONTRAST TECHNIQUE: Contiguous axial images were obtained from the base of the skull through the vertex without intravenous contrast. COMPARISON:  03/20/2015 FINDINGS: Brain: No evidence for acute brain infarct, intracranial hemorrhage or mass. Mass effect or midline shift identified. There is low attenuation within the subcortical and periventricular white matter compatible with chronic small vessel ischemic disease. Prominence of the sulci and ventricles noted  compatible with brain atrophy. Vascular: No hyperdense vessel or unexpected calcification. Skull:  The calvarium appears intact. Sinuses/Orbits: Mild mucosal thickening involving the left maxillary sinus measures 2 mm. There is also mild mucosal thickening involving the sphenoid sinus measuring 4 mm. Other: None. IMPRESSION: 1. No acute intracranial abnormalities. 2. Chronic small vessel ischemic change and brain atrophy. 3. Mild mucosal thickening involving the paranasal sinuses. Electronically Signed   By: Signa Kell M.D.   On: 10/21/2016 12:56   Ct Lumbar Spine Wo Contrast  Result Date: 10/21/2016 CLINICAL DATA:  83 year old female with low back pain after fall. Chronic T11 compression fracture and L4-L5 spondylolisthesis. EXAM: CT LUMBAR SPINE WITHOUT CONTRAST TECHNIQUE: Multidetector CT imaging of the lumbar spine was performed without intravenous contrast administration. Multiplanar CT image reconstructions were also generated. COMPARISON:  Lumbar radiographs 1133 hours today. Thoracic spine CT and lumbar radiographs 12/28/2015 FINDINGS: Segmentation: Normal. Alignment: Stable since 2017, with grade 2 spondylolisthesis of L4 on L5 (14-15 mm of spondylolisthesis). Associated chronic L4 spondylolisthesis. Mild dextroconvex curvature of the lumbar spine. Vertebrae: Osteopenia. Moderate to severe T11 compression fracture is stable since the 2017 CT. New mild T12 superior endplate compression since that time, less than 20% loss of vertebral body height. No significant bony retropulsion. The T12 pedicles and posterior elements appear intact. L1 superior and L2 inferior endplate deformities compatible with prominent Schmorl nodes appear stable from the prior CT. Mild concavity of the L3, L4, and L5 endplates appears stable from the 2017 lumbar radiographs. The S1 sacral level is only partially included but appears intact. Paraspinal and other soft tissues: Aortoiliac calcified atherosclerosis. Retained stool  throughout the colon. Otherwise negative visualized noncontrast abdominal viscera. Negative visualized posterior paraspinal soft tissues. Disc levels: Chronic grade 2 spondylolisthesis at L4-L5 with chronic L4 pars fractures and posterior element hypertrophy results in chronic moderate to severe spinal stenosis at that level. Elsewhere the lumbar spinal canal is capacious. IMPRESSION: 1. Mild T12 superior endplate compression fracture is new since 12/28/2015 and suspicious for acute injury in this clinical setting. If specific therapy such as vertebroplasty is desired, Lumbar MRI or Nuclear Medicine Whole-body Bone Scan would best confirm acuity. 2. No other acute osseous abnormality identified in lumbar spine. Osteopenia. Chronic T11 compression fracture. 3. L4-L5 chronic grade 2 spondylolisthesis and spondylolysis with moderate to severe spinal stenosis. 4.  Aortic Atherosclerosis (ICD10-I70.0). Electronically Signed   By: Odessa Fleming M.D.   On: 10/21/2016 13:02   Ct Pelvis Wo Contrast  Result Date: 10/21/2016 CLINICAL DATA:  Larey Seat.  Pelvic pain. EXAM: CT PELVIS WITHOUT CONTRAST TECHNIQUE: Multidetector CT imaging of the pelvis  was performed following the standard protocol without intravenous contrast. COMPARISON:  None. FINDINGS: Urinary Tract: The bladder is moderately distended. No calculi are identified. No obvious mass. Bowel:  The visualize colon and small bowel appear grossly normal. Vascular/Lymphatic: Advanced vascular calcifications but no aneurysm. No pelvic lymphadenopathy. Reproductive: Retroverted uterus with numerous calcified fibroids. Both ovaries appear normal. Other: No all pelvic mass, free pelvic fluid collections or inguinal adenopathy. Musculoskeletal: Bilateral pars defects are noted at L4 with a grade 2 spondylolisthesis. No pelvic or hip fractures are identified. The pubic symphysis and SI joints are intact. Mild degenerative changes. IMPRESSION: No acute pelvic or hip fractures are  identified. No acute intrapelvic findings are identified. Moderate bladder distention. Enlarged uterus with numerous calcified fibroids. Electronically Signed   By: Rudie Meyer M.D.   On: 10/21/2016 13:00   Dg Hand Complete Right  Result Date: 10/21/2016 CLINICAL DATA:  Status post fall.  Hand pain. EXAM: RIGHT HAND - COMPLETE 3+ VIEW COMPARISON:  None. FINDINGS: Severe generalized osteopenia. Severe osteoarthritis of the second, third and fourth DIP joints. Mild osteoarthritis of the fifth DIP joint. Mild osteoarthritis of the first [facility] joint, scaphotrapeziotrapezoid joint and first MCP joint. IMPRESSION: No acute osseous injury of the right hand. Electronically Signed   By: Elige Ko   On: 10/21/2016 12:06   Dg Hips Bilat With Pelvis 3-4 Views  Result Date: 10/21/2016 CLINICAL DATA:  Fall.  Hip pain. EXAM: DG HIP (WITH OR WITHOUT PELVIS) 3-4V BILAT COMPARISON:  12/28/2015. FINDINGS: Degenerative changes lumbar spine and both hips. Diffuse osteopenia. No acute bony or joint abnormality identified. No evidence of fracture dislocation. Calcified pelvic densities consistent fibroids. Peripheral vascular calcification. IMPRESSION: Diffuse osteopenia degenerative change.  No acute abnormality. 2.  Calcified fibroids. 3.  Peripheral vascular disease. Electronically Signed   By: Maisie Fus  Register   On: 10/21/2016 12:08   Assessment/Plan Active Problems:   ARF (acute renal failure) (HCC)   UTI (urinary tract infection)  1-AKI; in setting of infection. IV fluids. Repeat labs in am.   2-UTI; UA with positive nitrates. WBC 6-30  Continue with IV ceftriaxone.  Follow urine culture.   3-HTN; continue with Norvasc.   4-Weakness, fall; probably related to infection. Treating UTI  5-back pain, Compression fracture T 12.  Pain management.  Patient denies pain at this time.     DVT prophylaxis: Lovenox.  Code Status: DNR Family Communication: POA who was at bedside.  Disposition Plan:  To be  determine Consults called: none  Admission status: observation.    Alba Cory MD [facility] Pager [phone]  If 7PM-7AM, please contact night-coverage [email or web address] Password [facility]  10/21/2016, 5:42 PM

## 2016-10-21 NOTE — Plan of Care (Signed)
Problem: Education: Goal: Knowledge of treatment and prevention of UTI/Pyleonephritis will improve Outcome: Completed/Met Date Met: 10/21/16 Discussed with caregiver. Treatment causes

## 2016-10-21 NOTE — ED Triage Notes (Signed)
Pt to room 12 in w/c, assisted into bed x 2 assist. Pt is from [facility], witnessed fall last night, per staff member pt c/o right hip pain, right arm pain and low back pain. Caretaker also states that pt c/o feeling dizzy this am and is unsure if pt hit head when she fell. Pt is a/a, oriented to baseline, pleasantly confused, smiling and cooperative with care. Assisted into gown for md exam.

## 2016-10-22 DIAGNOSIS — R2681 Unsteadiness on feet: Secondary | ICD-10-CM | POA: Diagnosis not present

## 2016-10-22 DIAGNOSIS — I129 Hypertensive chronic kidney disease with stage 1 through stage 4 chronic kidney disease, or unspecified chronic kidney disease: Secondary | ICD-10-CM | POA: Diagnosis present

## 2016-10-22 DIAGNOSIS — E440 Moderate protein-calorie malnutrition: Secondary | ICD-10-CM | POA: Diagnosis present

## 2016-10-22 DIAGNOSIS — M4854XA Collapsed vertebra, not elsewhere classified, thoracic region, initial encounter for fracture: Secondary | ICD-10-CM | POA: Diagnosis present

## 2016-10-22 DIAGNOSIS — N183 Chronic kidney disease, stage 3 (moderate): Secondary | ICD-10-CM | POA: Diagnosis not present

## 2016-10-22 DIAGNOSIS — Z6823 Body mass index (BMI) 23.0-23.9, adult: Secondary | ICD-10-CM | POA: Diagnosis not present

## 2016-10-22 DIAGNOSIS — Z66 Do not resuscitate: Secondary | ICD-10-CM | POA: Diagnosis present

## 2016-10-22 DIAGNOSIS — F0390 Unspecified dementia without behavioral disturbance: Secondary | ICD-10-CM | POA: Diagnosis not present

## 2016-10-22 DIAGNOSIS — E785 Hyperlipidemia, unspecified: Secondary | ICD-10-CM | POA: Diagnosis not present

## 2016-10-22 DIAGNOSIS — R278 Other lack of coordination: Secondary | ICD-10-CM | POA: Diagnosis not present

## 2016-10-22 DIAGNOSIS — Z5189 Encounter for other specified aftercare: Secondary | ICD-10-CM | POA: Diagnosis not present

## 2016-10-22 DIAGNOSIS — N390 Urinary tract infection, site not specified: Secondary | ICD-10-CM | POA: Diagnosis not present

## 2016-10-22 DIAGNOSIS — I4891 Unspecified atrial fibrillation: Secondary | ICD-10-CM | POA: Diagnosis present

## 2016-10-22 DIAGNOSIS — N179 Acute kidney failure, unspecified: Secondary | ICD-10-CM | POA: Diagnosis not present

## 2016-10-22 DIAGNOSIS — S22080S Wedge compression fracture of T11-T12 vertebra, sequela: Secondary | ICD-10-CM | POA: Diagnosis not present

## 2016-10-22 DIAGNOSIS — W19XXXA Unspecified fall, initial encounter: Secondary | ICD-10-CM | POA: Diagnosis present

## 2016-10-22 DIAGNOSIS — Z7901 Long term (current) use of anticoagulants: Secondary | ICD-10-CM | POA: Diagnosis not present

## 2016-10-22 DIAGNOSIS — M6281 Muscle weakness (generalized): Secondary | ICD-10-CM | POA: Diagnosis not present

## 2016-10-22 LAB — CBC
HCT: 26.8 % — ABNORMAL LOW (ref 36.0–46.0)
HEMOGLOBIN: 9.5 g/dL — AB (ref 12.0–15.0)
MCH: 34.3 pg — AB (ref 26.0–34.0)
MCHC: 35.4 g/dL (ref 30.0–36.0)
MCV: 96.8 fL (ref 78.0–100.0)
Platelets: 170 10*3/uL (ref 150–400)
RBC: 2.77 MIL/uL — AB (ref 3.87–5.11)
RDW: 12.6 % (ref 11.5–15.5)
WBC: 4.8 10*3/uL (ref 4.0–10.5)

## 2016-10-22 LAB — BASIC METABOLIC PANEL
ANION GAP: 6 (ref 5–15)
BUN: 14 mg/dL (ref 6–20)
CALCIUM: 9 mg/dL (ref 8.9–10.3)
CO2: 24 mmol/L (ref 22–32)
Chloride: 110 mmol/L (ref 101–111)
Creatinine, Ser: 1.54 mg/dL — ABNORMAL HIGH (ref 0.44–1.00)
GFR calc non Af Amer: 27 mL/min — ABNORMAL LOW (ref >60)
GFR, EST AFRICAN AMERICAN: 31 mL/min — AB (ref >60)
Glucose, Bld: 76 mg/dL (ref 65–99)
Potassium: 3.9 mmol/L (ref 3.5–5.1)
Sodium: 140 mmol/L (ref 135–145)

## 2016-10-22 LAB — URINE CULTURE
CULTURE: NO GROWTH
Special Requests: NORMAL

## 2016-10-22 MED ORDER — DIVALPROEX SODIUM 125 MG PO CSDR
250.0000 mg | DELAYED_RELEASE_CAPSULE | Freq: Two times a day (BID) | ORAL | Status: DC
  Administered 2016-10-22 – 2016-10-25 (×7): 250 mg via ORAL
  Filled 2016-10-22 (×9): qty 2

## 2016-10-22 NOTE — Progress Notes (Signed)
PROGRESS NOTE    Crystal Rowland  ONG:[id number] DOB: 05-24-1914 DOA: 10/21/2016 PCP: Dois Davenport, MD   Brief Narrative: Crystal Rowland is a 83 y.o. female with a history of dementia, atrial fibrillation on anticoagulation she presented with weakness with her history of fall. Concern for UTI secondary to dysuria complaints. Patient started on empiric ceftriaxone and urine cultures pending. PT consulted for evaluation.   Assessment & Plan:   Active Problems:   ARF (acute renal failure) (HCC)   UTI (urinary tract infection)   Acute kidney injury on CKD 3 Baseline creatinine of 1.3-1.5. Improved from 1.73 to 1.54 with IV fluids  UTI Urine culture pending -follow-up urine culture -continue Ceftriaxone  Essential hypertension Well controlled -continue amlodipine  Weakness -PT eval  Back pain Compression fracture T-12 -Continue Norco  Dementia -Continue Seroquel/Depakote   DVT prophylaxis: Lovenox Code Status: DNR Family Communication: Care giver at bedside Disposition Plan: Discharge in 24-48 hours to SNF   Consultants:   None  Procedures:   None  Antimicrobials:  Ceftriaxone    Subjective: Afebrile  Objective: Vitals:   10/21/16 1645 10/21/16 2036 10/22/16 0601 10/22/16 1355  BP: (!) 155/57 (!) 115/45 101/60 (!) 106/48  Pulse: 77 71 70 68  Resp: 16 18 18 18   Temp: 97.8 F (36.6 C) 97.8 F (36.6 C) 98.8 F (37.1 C) 98.9 F (37.2 C)  TempSrc: Oral Oral Oral Oral  SpO2: 98% 98% 97% 98%  Weight:  48.8 kg (107 lb 9.4 oz)    Height:  4\' 11"  (1.499 m)      Intake/Output Summary (Last 24 hours) at 10/22/16 1752 Last data filed at 10/22/16 1429  Gross per 24 hour  Intake           2117.5 ml  Output              200 ml  Net           1917.5 ml   Filed Weights   10/21/16 2036  Weight: 48.8 kg (107 lb 9.4 oz)    Examination:  General exam: Appears calm and comfortable Respiratory system: Clear to auscultation. Respiratory  effort normal. Cardiovascular system: S1 & S2 heard, RRR. Gastrointestinal system: Abdomen is nondistended, soft and nontender. Normal bowel sounds heard. Central nervous system: Alert. Extremities: Mo calf tenderness Skin: No cyanosis. No rashes    Data Reviewed: I have personally reviewed following labs and imaging studies  CBC:  Recent Labs Lab 10/21/16 1200 10/22/16 0400  WBC 4.9 4.8  NEUTROABS 1.9  --   HGB 10.2* 9.5*  HCT 29.8* 26.8*  MCV 98.0 96.8  PLT 177 170   Basic Metabolic Panel:  Recent Labs Lab 10/21/16 1200 10/22/16 0400  NA 138 140  K 4.2 3.9  CL 103 110  CO2 27 24  GLUCOSE 87 76  BUN 16 14  CREATININE 1.73* 1.54*  CALCIUM 9.6 9.0   GFR: Estimated Creatinine Clearance: 13.6 mL/min (A) (by C-G formula based on SCr of 1.54 mg/dL (H)). Liver Function Tests: No results for input(s): AST, ALT, ALKPHOS, BILITOT, PROT, ALBUMIN in the last 168 hours. No results for input(s): LIPASE, AMYLASE in the last 168 hours. No results for input(s): AMMONIA in the last 168 hours. Coagulation Profile: No results for input(s): INR, PROTIME in the last 168 hours. Cardiac Enzymes:  Recent Labs Lab 10/21/16 1200  CKTOTAL 31*   BNP (last 3 results) No results for input(s): PROBNP in the last 8760 hours. HbA1C: No  results for input(s): HGBA1C in the last 72 hours. CBG: No results for input(s): GLUCAP in the last 168 hours. Lipid Profile: No results for input(s): CHOL, HDL, LDLCALC, TRIG, CHOLHDL, LDLDIRECT in the last 72 hours. Thyroid Function Tests: No results for input(s): TSH, T4TOTAL, FREET4, T3FREE, THYROIDAB in the last 72 hours. Anemia Panel: No results for input(s): VITAMINB12, FOLATE, FERRITIN, TIBC, IRON, RETICCTPCT in the last 72 hours. Sepsis Labs: No results for input(s): PROCALCITON, LATICACIDVEN in the last 168 hours.  Recent Results (from the past 240 hour(s))  Urine culture     Status: None   Collection Time: 10/21/16 12:30 PM  Result  Value Ref Range Status   Specimen Description URINE, CATHETERIZED  Final   Special Requests Normal  Final   Culture   Final    NO GROWTH Performed at [facility], [address] [place], [place] [address]    Report Status 10/22/2016 FINAL  Final         Radiology Studies: Dg Chest 2 View  Result Date: 10/21/2016 CLINICAL DATA:  Fall EXAM: CHEST  2 VIEW COMPARISON:  03/21/2016 FINDINGS: Lungs are clear.  No pleural effusion or pneumothorax. The heart is normal in size. Stable mild superior endplate compression fracture deformity involving a lower thoracic vertebral body. IMPRESSION: No evidence of acute cardiopulmonary disease. Electronically Signed   By: Charline Bills M.D.   On: 10/21/2016 12:12   Dg Lumbar Spine Complete  Result Date: 10/21/2016 CLINICAL DATA:  Fall with low back pain. EXAM: LUMBAR SPINE - COMPLETE 4+ VIEW COMPARISON:  12/28/2015 FINDINGS: Bones are diffusely demineralized. Demineralization hinders assessment. Lateral film again shows compression deformity at the level of T11 and mild anterior wedge deformity of L5, stable in the interval. Grade 2 anterolisthesis of L4 on 5 is unchanged. SI joints are unremarkable. Dystrophic calcification over the central pelvis compatible with uterine fibroids. IMPRESSION: Stable exam. Compression deformity seen at T11 and L5 with grade 2 anterolisthesis of L4 on 5. No definite acute fracture on today's study. Electronically Signed   By: Kennith Center M.D.   On: 10/21/2016 12:10   Ct Head Wo Contrast  Result Date: 10/21/2016 CLINICAL DATA:  Recent fall. EXAM: CT HEAD WITHOUT CONTRAST TECHNIQUE: Contiguous axial images were obtained from the base of the skull through the vertex without intravenous contrast. COMPARISON:  03/20/2015 FINDINGS: Brain: No evidence for acute brain infarct, intracranial hemorrhage or mass. Mass effect or midline shift identified. There is low attenuation within the subcortical and periventricular  white matter compatible with chronic small vessel ischemic disease. Prominence of the sulci and ventricles noted compatible with brain atrophy. Vascular: No hyperdense vessel or unexpected calcification. Skull:  The calvarium appears intact. Sinuses/Orbits: Mild mucosal thickening involving the left maxillary sinus measures 2 mm. There is also mild mucosal thickening involving the sphenoid sinus measuring 4 mm. Other: None. IMPRESSION: 1. No acute intracranial abnormalities. 2. Chronic small vessel ischemic change and brain atrophy. 3. Mild mucosal thickening involving the paranasal sinuses. Electronically Signed   By: Signa Kell M.D.   On: 10/21/2016 12:56   Ct Lumbar Spine Wo Contrast  Result Date: 10/21/2016 CLINICAL DATA:  83 year old female with low back pain after fall. Chronic T11 compression fracture and L4-L5 spondylolisthesis. EXAM: CT LUMBAR SPINE WITHOUT CONTRAST TECHNIQUE: Multidetector CT imaging of the lumbar spine was performed without intravenous contrast administration. Multiplanar CT image reconstructions were also generated. COMPARISON:  Lumbar radiographs 1133 hours today. Thoracic spine CT and lumbar radiographs 12/28/2015 FINDINGS:  Segmentation: Normal. Alignment: Stable since 2017, with grade 2 spondylolisthesis of L4 on L5 (14-15 mm of spondylolisthesis). Associated chronic L4 spondylolisthesis. Mild dextroconvex curvature of the lumbar spine. Vertebrae: Osteopenia. Moderate to severe T11 compression fracture is stable since the 2017 CT. New mild T12 superior endplate compression since that time, less than 20% loss of vertebral body height. No significant bony retropulsion. The T12 pedicles and posterior elements appear intact. L1 superior and L2 inferior endplate deformities compatible with prominent Schmorl nodes appear stable from the prior CT. Mild concavity of the L3, L4, and L5 endplates appears stable from the 2017 lumbar radiographs. The S1 sacral level is only partially  included but appears intact. Paraspinal and other soft tissues: Aortoiliac calcified atherosclerosis. Retained stool throughout the colon. Otherwise negative visualized noncontrast abdominal viscera. Negative visualized posterior paraspinal soft tissues. Disc levels: Chronic grade 2 spondylolisthesis at L4-L5 with chronic L4 pars fractures and posterior element hypertrophy results in chronic moderate to severe spinal stenosis at that level. Elsewhere the lumbar spinal canal is capacious. IMPRESSION: 1. Mild T12 superior endplate compression fracture is new since 12/28/2015 and suspicious for acute injury in this clinical setting. If specific therapy such as vertebroplasty is desired, Lumbar MRI or Nuclear Medicine Whole-body Bone Scan would best confirm acuity. 2. No other acute osseous abnormality identified in lumbar spine. Osteopenia. Chronic T11 compression fracture. 3. L4-L5 chronic grade 2 spondylolisthesis and spondylolysis with moderate to severe spinal stenosis. 4.  Aortic Atherosclerosis (ICD10-I70.0). Electronically Signed   By: Odessa Fleming M.D.   On: 10/21/2016 13:02   Ct Pelvis Wo Contrast  Result Date: 10/21/2016 CLINICAL DATA:  Larey Seat.  Pelvic pain. EXAM: CT PELVIS WITHOUT CONTRAST TECHNIQUE: Multidetector CT imaging of the pelvis was performed following the standard protocol without intravenous contrast. COMPARISON:  None. FINDINGS: Urinary Tract: The bladder is moderately distended. No calculi are identified. No obvious mass. Bowel:  The visualize colon and small bowel appear grossly normal. Vascular/Lymphatic: Advanced vascular calcifications but no aneurysm. No pelvic lymphadenopathy. Reproductive: Retroverted uterus with numerous calcified fibroids. Both ovaries appear normal. Other: No all pelvic mass, free pelvic fluid collections or inguinal adenopathy. Musculoskeletal: Bilateral pars defects are noted at L4 with a grade 2 spondylolisthesis. No pelvic or hip fractures are identified. The pubic  symphysis and SI joints are intact. Mild degenerative changes. IMPRESSION: No acute pelvic or hip fractures are identified. No acute intrapelvic findings are identified. Moderate bladder distention. Enlarged uterus with numerous calcified fibroids. Electronically Signed   By: Rudie Meyer M.D.   On: 10/21/2016 13:00   Dg Hand Complete Right  Result Date: 10/21/2016 CLINICAL DATA:  Status post fall.  Hand pain. EXAM: RIGHT HAND - COMPLETE 3+ VIEW COMPARISON:  None. FINDINGS: Severe generalized osteopenia. Severe osteoarthritis of the second, third and fourth DIP joints. Mild osteoarthritis of the fifth DIP joint. Mild osteoarthritis of the first [facility] joint, scaphotrapeziotrapezoid joint and first MCP joint. IMPRESSION: No acute osseous injury of the right hand. Electronically Signed   By: Elige Ko   On: 10/21/2016 12:06   Dg Hips Bilat With Pelvis 3-4 Views  Result Date: 10/21/2016 CLINICAL DATA:  Fall.  Hip pain. EXAM: DG HIP (WITH OR WITHOUT PELVIS) 3-4V BILAT COMPARISON:  12/28/2015. FINDINGS: Degenerative changes lumbar spine and both hips. Diffuse osteopenia. No acute bony or joint abnormality identified. No evidence of fracture dislocation. Calcified pelvic densities consistent fibroids. Peripheral vascular calcification. IMPRESSION: Diffuse osteopenia degenerative change.  No acute abnormality. 2.  Calcified fibroids. 3.  Peripheral  vascular disease. Electronically Signed   By: Maisie Fus  Register   On: 10/21/2016 12:08        Scheduled Meds: . acetaminophen  500 mg Oral BID  . amLODipine  2.5 mg Oral BID  . divalproex  250 mg Oral Q12H  . docusate sodium  100 mg Oral BID  . enoxaparin (LOVENOX) injection  30 mg Subcutaneous Q24H  . multivitamins with iron  1 tablet Oral Daily  . polyethylene glycol  17 g Oral Daily  . polyvinyl alcohol  1 drop Both Eyes TID  . QUEtiapine  100 mg Oral QHS   Continuous Infusions: . sodium chloride 75 mL/hr at 10/21/16 2156  . cefTRIAXone (ROCEPHIN)   IV Stopped (10/22/16 1237)     LOS: 0 days     Jacquelin Hawking, MD [facility] 10/22/2016, 5:52 PM Pager: [phone]  If 7PM-7AM, please contact night-coverage [email or web address] Password [facility] 10/22/2016, 5:52 PM

## 2016-10-22 NOTE — Clinical Social Work Note (Addendum)
Clinical Social Work Assessment  Patient Details  Name: Crystal Rowland MRN: [id number] Date of Birth: 1914-05-07  Date of referral:  10/22/16               Reason for consult:   (admitted from facility)                Permission sought to share information with:  Family Supports, Magazine features editor Permission granted to share information::  Yes, Verbal Permission Granted  Name::     POA/Friend USAA::  [facility] [phone]  Relationship::     Contact Information:     Housing/Transportation Living arrangements for the past 2 months:  Assisted Living Facility Source of Information:  Facility, Power of Pensions consultant, Engineer, mining Patient Interpreter Needed:  None Criminal Activity/Legal Involvement Pertinent to Current Situation/Hospitalization:  No - Comment as needed Significant Relationships:  Friend, [employer] Lives with:  Facility Resident Do you feel safe going back to the place where you live?  Yes Need for family participation in patient care:  Yes (Comment) (pt with dementia, POA primary decision maker)  Care giving concerns:  Pt from [facility] where she has been a resident x 3 years. She has dementia and is fluctuating in level of orientation per facility and friend (POA Lanora Manis is her main support). No behaviors or wandering. At baseline pt needs assistance with transfers and ambulates with walker but requires prompts to take steps. Needs assistance with toileting, bathing, meals.  States pt fell a couple of days ago at facility, unsure of circumstances. POA states pt has been getting weak prior to this admission.  Pt has 24/7 caregiver through [employer].   Social Worker assessment / plan:  CSW consulted as pt is from facility- [facility], [facility] building (not memory care). Has dementia and while cooperative is not good source of information due to orientation. Oriented to  person/place at time of assessment.  POA states she feels pt's needs are well-managed at ALF and plans for her to return at DC. She notes that if there becomes need for skilled rehab at DC, she would be open to that as well but currently no needs identified.  CSW spoke with facility. Will need updated FL2 at time of DC.  Plan: Return to [facility] at DC.   Employment status:  Retired Database administrator PT Recommendations:  Not assessed at this time Information / Referral to community resources:     Patient/Family's Response to care:  Appreciative of care  Patient/Family's Understanding of and Emotional Response to Diagnosis, Current Treatment, and Prognosis:  Pt does not demonstrate understanding however POA shows thorough understanding of pt's status and potential plans. Emotionally appropriate and positive.  Emotional Assessment Appearance:  Appears stated age Attitude/Demeanor/Rapport:   (pt interacting with other staff during assessment- cooperative) Affect (typically observed):  Calm Orientation:  Oriented to Self, Oriented to Place Alcohol / Substance use:  Not Applicable Psych involvement (Current and /or in the community):  No (Comment)  Discharge Needs  Concerns to be addressed:  Discharge Planning Concerns Readmission within the last 30 days:  No Current discharge risk:  None Barriers to Discharge:  Continued Medical Work up   [employer], LCSW 10/22/2016, 10:42 AM  [phone]

## 2016-10-23 DIAGNOSIS — F0390 Unspecified dementia without behavioral disturbance: Secondary | ICD-10-CM

## 2016-10-23 MED ORDER — SODIUM CHLORIDE 0.45 % IV SOLN
INTRAVENOUS | Status: AC
  Administered 2016-10-23: 14:00:00 via INTRAVENOUS

## 2016-10-23 NOTE — Evaluation (Signed)
Physical Therapy Evaluation Patient Details Name: Crystal Rowland MRN: [id number] DOB: 01-05-1915 Today's Date: 10/23/2016   History of Present Illness  83 y.o. female with a history of dementia, atrial fibrillation on anticoagulation and admitted for acute kidney injury and UTI.  Clinical Impression  Pt admitted with above diagnosis. Pt currently with functional limitations due to the deficits listed below (see PT Problem List).  Pt will benefit from skilled PT to increase their independence and safety with mobility to allow discharge to the venue listed below.   Pt found to have compression fx at T12 with hx of falls and fall just prior to admission.  Caregiver present and reports pt is typically ambulatory with rollator however only requires cues not physical assist.  Pt currently requiring mod assist and very unsteady with mobility at this time.  Pt only able to ambulate 5 feet x2.  Pt would benefit from d/c to SNF as caregivers are 24/7 however likely unable to provide current physical assist level.     Follow Up Recommendations SNF    Equipment Recommendations  None recommended by PT    Recommendations for Other Services       Precautions / Restrictions Precautions Precautions: Fall      Mobility  Bed Mobility Overal bed mobility: Needs Assistance Bed Mobility: Sidelying to Sit   Sidelying to sit: Min assist       General bed mobility comments: pt laying to left side, assist for trunk upright  Transfers Overall transfer level: Needs assistance Equipment used: Rolling walker (2 wheeled) Transfers: Sit to/from Stand Sit to Stand: Mod assist;+2 safety/equipment         General transfer comment: manual cues for hand placement, assist to rise and steady, posterior lean present  Ambulation/Gait Ambulation/Gait assistance: Mod assist;+2 physical assistance Ambulation Distance (Feet): 5 Feet (x2) Assistive device: Rolling walker (2 wheeled) Gait  Pattern/deviations: Decreased stride length;Narrow base of support;Scissoring;Step-through pattern     General Gait Details: very narrow BOS with occasional scissoring which caregiver reports is baseline, assist for weakness and steadiness, attempted to ambulate to bathroom however for safety brought BSC to pt, pt then ambulated a couple more feet to recliner which was also brought up behind her, pt did not appear to turn well   Stairs            Wheelchair Mobility    Modified Rankin (Stroke Patients Only)       Balance Overall balance assessment: History of Falls                                           Pertinent Vitals/Pain Pain Assessment: Faces (does not appear in distress) Faces Pain Scale: No hurt Pain Intervention(s): Repositioned    Home Living     Available Help at Discharge: Available 24 hours/day;Personal care attendant Type of Home: Independent living facility         Home Equipment: Walker - 4 wheels (rollator with seat)      Prior Function Level of Independence: Needs assistance   Gait / Transfers Assistance Needed: pt typically ambulatory with rollator with caregiver stand by assist  ADL's / Homemaking Assistance Needed: requires assist for completing ADLs and hygiene  Comments: above per caregiver present in room     Hand Dominance        Extremity/Trunk Assessment   Upper Extremity Assessment Upper Extremity Assessment:  Generalized weakness    Lower Extremity Assessment Lower Extremity Assessment: Generalized weakness    Cervical / Trunk Assessment Cervical / Trunk Assessment: Kyphotic  Communication   Communication: HOH  Cognition Arousal/Alertness: Awake/alert (awake during session, however seen during her typical "naptime") Behavior During Therapy: Flat affect Overall Cognitive Status: History of cognitive impairments - at baseline                                        General Comments       Exercises     Assessment/Plan    PT Assessment Patient needs continued PT services  PT Problem List Decreased strength;Decreased range of motion;Decreased knowledge of use of DME;Decreased mobility;Decreased balance;Decreased activity tolerance       PT Treatment Interventions DME instruction;Gait training;Therapeutic activities;Therapeutic exercise;Functional mobility training;Patient/family education;Balance training    PT Goals (Current goals can be found in the Care Plan section)  Acute Rehab PT Goals PT Goal Formulation: Patient unable to participate in goal setting Time For Goal Achievement: 15-Jul-2016 Potential to Achieve Goals: Good    Frequency Min 2X/week   Barriers to discharge        Co-evaluation               AM-PAC PT "6 Clicks" Daily Activity  Outcome Measure Difficulty turning over in bed (including adjusting bedclothes, sheets and blankets)?: Unable Difficulty moving from lying on back to sitting on the side of the bed? : Unable Difficulty sitting down on and standing up from a chair with arms (e.g., wheelchair, bedside commode, etc,.)?: Unable Help needed moving to and from a bed to chair (including a wheelchair)?: A Lot Help needed walking in hospital room?: A Lot Help needed climbing 3-5 steps with a railing? : Total 6 Click Score: 8    End of Session Equipment Utilized During Treatment: Gait belt Activity Tolerance: Patient limited by fatigue Patient left: with call bell/phone within reach;in chair;with nursing/sitter in room Nurse Communication: Mobility status (aware pt up in recliner with caregiver present) PT Visit Diagnosis: Other abnormalities of gait and mobility (R26.89);History of falling (Z91.81)    Time: 3785-8850 PT Time Calculation (min) (ACUTE ONLY): 22 min   Charges:   PT Evaluation $PT Eval Moderate Complexity: 1 Mod     PT G Codes:        Zenovia Jarred, PT, DPT 10/23/2016 Pager: [phone]  Maida Sale  E 10/23/2016, 1:04 PM

## 2016-10-23 NOTE — Progress Notes (Signed)
CSW has been following as pt admitted from facility- [facility]- [facility] building. Reviewed therapy recommendations with pt and POA (neices also at bedside). They agree pt could benefit from rehab at DC prior to returning home to her ALF. Prefer [facility] or [facility]. CSW spoke with pt's RN at [facility] as well, reviewed pt's level of assistance needed per evaluation today and they confirm this is far from pt's baseline and would agree to pursue rehab since pt agreeable. Will complete FL2 and make referrals for ST rehab.  Ilean Skill, MSW, LCSW Clinical Social Work 10/23/2016 [phone]

## 2016-10-23 NOTE — Discharge Summary (Addendum)
Physician Discharge Summary  Anushree Dorsi Butner YQM:[id number] DOB: 1914/11/10 DOA: 10/21/2016  PCP: Dois Davenport, MD  Admit date: 10/21/2016 Discharge date: 10/25/2016  Admitted From: ALF Disposition: SNF  Recommendations for Outpatient Follow-up:  1. Follow up with PCP in 1 week 2. Please obtain BMP/CBC in one week 3. Please follow up on the following pending results: None  Home Health: SNF Equipment/Devices: SNF  Discharge Condition: Stable CODE STATUS: DNR Diet recommendation:   MVI daily   Ensure Enlive po BID, each supplement provides 350 kcal and 20 grams of protein  Magic cup TID with meals, each supplement provides 290 kcal and 9 grams of protein   Brief/Interim Summary:  Admission HPI written by Hartley Barefoot,   Chief Complaint: Fall, Weakness.   HPI: Crystal Rowland is a 83 y.o. female with medical history significant of Dementia, A fib not on anticoagulation, who presents with worsening weakness over last few days. Patient also fell the day prior to admission, unclear at this time how she fell. Patient has been complaining go dysuria , and back pain. Her urine has been dark.   POA is at bedside. She report patient has been having worsening memory problems, and some times doesn't recognized POA and or caregiver.   ED Course: Patient was found to have cr at 1.7, UA with positive nitrates and WBC 6-30.    Hospital course:  Acute kidney injury on CKD 3 Baseline creatinine of 1.3-1.5. Improved from 1.73 to 1.54 with IV fluids. Likely made worse secondary to patient's decreased oral intake which is secondary to dementia. Patient's oral intake improved. Dietitian consulted and provided recommendations. Repeat BMP in one week.  UTI Urinalysis suggestive of infection, however, urine culture was negative. Patient received three days of ceftriaxone.  Essential hypertension Well controlled. Continued amlodipine.  Weakness Physical therapy  recommending SNF  Back pain Compression fracture T-12. Norco on admission but not needing. Will discontinue on discharge. Tylenol as needed.  Dementia Patient at baseline. Continued Seroquel and Depakote.  Moderate malnutrition Seen by dietitian. Protein supplement started.  Discharge Diagnoses:  Principal Problem:   ARF (acute renal failure) (HCC) Active Problems:   Hyperlipidemia   CKD (chronic kidney disease) stage 3, GFR 30-59 ml/min   UTI (urinary tract infection)   Moderate malnutrition (HCC)    Discharge Instructions  Discharge Instructions    Increase activity slowly    Complete by:  As directed      Allergies as of 10/24/2016   No Known Allergies     Medication List    STOP taking these medications   clindamycin 300 MG capsule Commonly known as:  CLEOCIN   HYDROcodone-acetaminophen 5-325 MG tablet Commonly known as:  NORCO/VICODIN     TAKE these medications   acetaminophen 500 MG tablet Commonly known as:  TYLENOL Take 500 mg by mouth 2 (two) times daily.   amLODipine 2.5 MG tablet Commonly known as:  NORVASC Take 2.5 mg by mouth 2 (two) times daily.   divalproex 250 MG DR tablet Commonly known as:  DEPAKOTE Take 250 mg by mouth 2 (two) times daily.   docusate sodium 100 MG capsule Commonly known as:  COLACE Take 1 capsule (100 mg total) by mouth 2 (two) times daily.   feeding supplement (ENSURE ENLIVE) Liqd Take 237 mLs by mouth 2 (two) times daily between meals.   Melatonin 1 MG Tabs Take 1 mg by mouth at bedtime as needed (insomnia).   mirtazapine 15 MG tablet Commonly known as:  REMERON Take 15 mg by mouth at bedtime.   multivitamins with iron Tabs tablet Take 1 tablet by mouth daily.   polyethylene glycol packet Commonly known as:  MIRALAX / GLYCOLAX Take 17 g by mouth daily.   polyvinyl alcohol 1.4 % ophthalmic solution Commonly known as:  LIQUIFILM TEARS Place 1 drop into both eyes 3 (three) times daily.   QUEtiapine  100 MG tablet Commonly known as:  SEROQUEL Take 100 mg by mouth at bedtime.            Discharge Care Instructions        Start     Ordered   10/24/16 0000  docusate sodium (COLACE) 100 MG capsule  2 times daily     10/24/16 1347   10/24/16 0000  feeding supplement, ENSURE ENLIVE, (ENSURE ENLIVE) LIQD  2 times daily between meals     10/24/16 1347   10/24/16 0000  Increase activity slowly     10/24/16 1347     Follow-up Information    Dois Davenport, MD. Schedule an appointment as soon as possible for a visit in 1 week(s).   Specialty:  Family Medicine Contact information: [address] [place] [address] [place] [address] [phone]          No Known Allergies  Consultations:  None   Procedures/Studies: Dg Chest 2 View  Result Date: 10/21/2016 CLINICAL DATA:  Fall EXAM: CHEST  2 VIEW COMPARISON:  03/21/2016 FINDINGS: Lungs are clear.  No pleural effusion or pneumothorax. The heart is normal in size. Stable mild superior endplate compression fracture deformity involving a lower thoracic vertebral body. IMPRESSION: No evidence of acute cardiopulmonary disease. Electronically Signed   By: Charline Bills M.D.   On: 10/21/2016 12:12   Dg Lumbar Spine Complete  Result Date: 10/21/2016 CLINICAL DATA:  Fall with low back pain. EXAM: LUMBAR SPINE - COMPLETE 4+ VIEW COMPARISON:  12/28/2015 FINDINGS: Bones are diffusely demineralized. Demineralization hinders assessment. Lateral film again shows compression deformity at the level of T11 and mild anterior wedge deformity of L5, stable in the interval. Grade 2 anterolisthesis of L4 on 5 is unchanged. SI joints are unremarkable. Dystrophic calcification over the central pelvis compatible with uterine fibroids. IMPRESSION: Stable exam. Compression deformity seen at T11 and L5 with grade 2 anterolisthesis of L4 on 5. No definite acute fracture on today's study. Electronically Signed   By: Kennith Center M.D.   On: 10/21/2016  12:10   Ct Head Wo Contrast  Result Date: 10/21/2016 CLINICAL DATA:  Recent fall. EXAM: CT HEAD WITHOUT CONTRAST TECHNIQUE: Contiguous axial images were obtained from the base of the skull through the vertex without intravenous contrast. COMPARISON:  03/20/2015 FINDINGS: Brain: No evidence for acute brain infarct, intracranial hemorrhage or mass. Mass effect or midline shift identified. There is low attenuation within the subcortical and periventricular white matter compatible with chronic small vessel ischemic disease. Prominence of the sulci and ventricles noted compatible with brain atrophy. Vascular: No hyperdense vessel or unexpected calcification. Skull:  The calvarium appears intact. Sinuses/Orbits: Mild mucosal thickening involving the left maxillary sinus measures 2 mm. There is also mild mucosal thickening involving the sphenoid sinus measuring 4 mm. Other: None. IMPRESSION: 1. No acute intracranial abnormalities. 2. Chronic small vessel ischemic change and brain atrophy. 3. Mild mucosal thickening involving the paranasal sinuses. Electronically Signed   By: Signa Kell M.D.   On: 10/21/2016 12:56   Ct Lumbar Spine Wo Contrast  Result Date: 10/21/2016 CLINICAL DATA:  83 year old female with low back pain after fall. Chronic T11 compression fracture and L4-L5 spondylolisthesis. EXAM: CT LUMBAR SPINE WITHOUT CONTRAST TECHNIQUE: Multidetector CT imaging of the lumbar spine was performed without intravenous contrast administration. Multiplanar CT image reconstructions were also generated. COMPARISON:  Lumbar radiographs 1133 hours today. Thoracic spine CT and lumbar radiographs 12/28/2015 FINDINGS: Segmentation: Normal. Alignment: Stable since 2017, with grade 2 spondylolisthesis of L4 on L5 (14-15 mm of spondylolisthesis). Associated chronic L4 spondylolisthesis. Mild dextroconvex curvature of the lumbar spine. Vertebrae: Osteopenia. Moderate to severe T11 compression fracture is stable since the  2017 CT. New mild T12 superior endplate compression since that time, less than 20% loss of vertebral body height. No significant bony retropulsion. The T12 pedicles and posterior elements appear intact. L1 superior and L2 inferior endplate deformities compatible with prominent Schmorl nodes appear stable from the prior CT. Mild concavity of the L3, L4, and L5 endplates appears stable from the 2017 lumbar radiographs. The S1 sacral level is only partially included but appears intact. Paraspinal and other soft tissues: Aortoiliac calcified atherosclerosis. Retained stool throughout the colon. Otherwise negative visualized noncontrast abdominal viscera. Negative visualized posterior paraspinal soft tissues. Disc levels: Chronic grade 2 spondylolisthesis at L4-L5 with chronic L4 pars fractures and posterior element hypertrophy results in chronic moderate to severe spinal stenosis at that level. Elsewhere the lumbar spinal canal is capacious. IMPRESSION: 1. Mild T12 superior endplate compression fracture is new since 12/28/2015 and suspicious for acute injury in this clinical setting. If specific therapy such as vertebroplasty is desired, Lumbar MRI or Nuclear Medicine Whole-body Bone Scan would best confirm acuity. 2. No other acute osseous abnormality identified in lumbar spine. Osteopenia. Chronic T11 compression fracture. 3. L4-L5 chronic grade 2 spondylolisthesis and spondylolysis with moderate to severe spinal stenosis. 4.  Aortic Atherosclerosis (ICD10-I70.0). Electronically Signed   By: Odessa Fleming M.D.   On: 10/21/2016 13:02   Ct Pelvis Wo Contrast  Result Date: 10/21/2016 CLINICAL DATA:  Larey Seat.  Pelvic pain. EXAM: CT PELVIS WITHOUT CONTRAST TECHNIQUE: Multidetector CT imaging of the pelvis was performed following the standard protocol without intravenous contrast. COMPARISON:  None. FINDINGS: Urinary Tract: The bladder is moderately distended. No calculi are identified. No obvious mass. Bowel:  The visualize  colon and small bowel appear grossly normal. Vascular/Lymphatic: Advanced vascular calcifications but no aneurysm. No pelvic lymphadenopathy. Reproductive: Retroverted uterus with numerous calcified fibroids. Both ovaries appear normal. Other: No all pelvic mass, free pelvic fluid collections or inguinal adenopathy. Musculoskeletal: Bilateral pars defects are noted at L4 with a grade 2 spondylolisthesis. No pelvic or hip fractures are identified. The pubic symphysis and SI joints are intact. Mild degenerative changes. IMPRESSION: No acute pelvic or hip fractures are identified. No acute intrapelvic findings are identified. Moderate bladder distention. Enlarged uterus with numerous calcified fibroids. Electronically Signed   By: Rudie Meyer M.D.   On: 10/21/2016 13:00   Dg Hand Complete Right  Result Date: 10/21/2016 CLINICAL DATA:  Status post fall.  Hand pain. EXAM: RIGHT HAND - COMPLETE 3+ VIEW COMPARISON:  None. FINDINGS: Severe generalized osteopenia. Severe osteoarthritis of the second, third and fourth DIP joints. Mild osteoarthritis of the fifth DIP joint. Mild osteoarthritis of the first [facility] joint, scaphotrapeziotrapezoid joint and first MCP joint. IMPRESSION: No acute osseous injury of the right hand. Electronically Signed   By: Elige Ko   On: 10/21/2016 12:06   Dg Hips Bilat With Pelvis 3-4 Views  Result Date: 10/21/2016 CLINICAL DATA:  Fall.  Hip pain. EXAM:  DG HIP (WITH OR WITHOUT PELVIS) 3-4V BILAT COMPARISON:  12/28/2015. FINDINGS: Degenerative changes lumbar spine and both hips. Diffuse osteopenia. No acute bony or joint abnormality identified. No evidence of fracture dislocation. Calcified pelvic densities consistent fibroids. Peripheral vascular calcification. IMPRESSION: Diffuse osteopenia degenerative change.  No acute abnormality. 2.  Calcified fibroids. 3.  Peripheral vascular disease. Electronically Signed   By: Maisie Fus  Register   On: 10/21/2016 12:08     Subjective: No  issues overnight. Afebrile.  Discharge Exam: Vitals:   10/24/16 0649 10/24/16 1051  BP: (!) 127/53 (!) 124/56  Pulse: 68   Resp: 18   Temp: 98.4 F (36.9 C)   SpO2: 100%    Vitals:   10/23/16 1749 10/23/16 2202 10/24/16 0649 10/24/16 1051  BP: 96/74 (!) 124/48 (!) 127/53 (!) 124/56  Pulse: 69 76 68   Resp: 16 18 18    Temp:  98.3 F (36.8 C) 98.4 F (36.9 C)   TempSrc:  Oral Oral   SpO2: 97% 94% 100%   Weight:      Height:        General: Pt is alert, awake, not in acute distress Cardiovascular: RRR, S1/S2 +, no rubs, no gallops. Harsh systolic murmur Respiratory: CTA bilaterally, no wheezing, no rhonchi Abdominal: Soft, NT, ND, bowel sounds + Extremities: no edema, no cyanosis    The results of significant diagnostics from this hospitalization (including imaging, microbiology, ancillary and laboratory) are listed below for reference.     Microbiology: Recent Results (from the past 240 hour(s))  Urine culture     Status: None   Collection Time: 10/21/16 12:30 PM  Result Value Ref Range Status   Specimen Description URINE, CATHETERIZED  Final   Special Requests Normal  Final   Culture   Final    NO GROWTH Performed at [facility], [address] [place], [place] [address]    Report Status 10/22/2016 FINAL  Final     Labs: BNP (last 3 results) No results for input(s): BNP in the last 8760 hours. Basic Metabolic Panel:  Recent Labs Lab 10/21/16 1200 10/22/16 0400  NA 138 140  K 4.2 3.9  CL 103 110  CO2 27 24  GLUCOSE 87 76  BUN 16 14  CREATININE 1.73* 1.54*  CALCIUM 9.6 9.0   Liver Function Tests: No results for input(s): AST, ALT, ALKPHOS, BILITOT, PROT, ALBUMIN in the last 168 hours. No results for input(s): LIPASE, AMYLASE in the last 168 hours. No results for input(s): AMMONIA in the last 168 hours. CBC:  Recent Labs Lab 10/21/16 1200 10/22/16 0400  WBC 4.9 4.8  NEUTROABS 1.9  --   HGB 10.2* 9.5*  HCT 29.8* 26.8*  MCV 98.0  96.8  PLT 177 170   Cardiac Enzymes:  Recent Labs Lab 10/21/16 1200  CKTOTAL 31*   BNP: Invalid input(s): POCBNP CBG: No results for input(s): GLUCAP in the last 168 hours. D-Dimer No results for input(s): DDIMER in the last 72 hours. Hgb A1c No results for input(s): HGBA1C in the last 72 hours. Lipid Profile No results for input(s): CHOL, HDL, LDLCALC, TRIG, CHOLHDL, LDLDIRECT in the last 72 hours. Thyroid function studies No results for input(s): TSH, T4TOTAL, T3FREE, THYROIDAB in the last 72 hours.  Invalid input(s): FREET3 Anemia work up No results for input(s): VITAMINB12, FOLATE, FERRITIN, TIBC, IRON, RETICCTPCT in the last 72 hours. Urinalysis    Component Value Date/Time   COLORURINE YELLOW 10/21/2016 1230   APPEARANCEUR CLOUDY (A) 10/21/2016 1230  LABSPEC 1.011 10/21/2016 1230   PHURINE 7.0 10/21/2016 1230   GLUCOSEU NEGATIVE 10/21/2016 1230   HGBUR NEGATIVE 10/21/2016 1230   BILIRUBINUR NEGATIVE 10/21/2016 1230   BILIRUBINUR negative 06/24/2011 1609   KETONESUR NEGATIVE 10/21/2016 1230   PROTEINUR NEGATIVE 10/21/2016 1230   UROBILINOGEN 0.2 10/27/2012 1625   NITRITE POSITIVE (A) 10/21/2016 1230   LEUKOCYTESUR LARGE (A) 10/21/2016 1230   Sepsis Labs Invalid input(s): PROCALCITONIN,  WBC,  LACTICIDVEN Microbiology Recent Results (from the past 240 hour(s))  Urine culture     Status: None   Collection Time: 10/21/16 12:30 PM  Result Value Ref Range Status   Specimen Description URINE, CATHETERIZED  Final   Special Requests Normal  Final   Culture   Final    NO GROWTH Performed at [facility], [address] [place], [place] [address]    Report Status 10/22/2016 FINAL  Final     Time coordinating discharge: Over 30 minutes  SIGNED:   Jacquelin Hawking, MD [facility] 10/24/2016, 2:02 PM Pager [phone]  If 7PM-7AM, please contact night-coverage [email or web address] Password [email or web address]

## 2016-10-23 NOTE — Progress Notes (Signed)
PROGRESS NOTE    Crystal Rowland  ZOX:[id number] DOB: [birth year] DOA: 10/21/2016 PCP: Dois Davenport, MD   Brief Narrative: Crystal Rowland is a 83 y.o. female with a history of dementia, atrial fibrillation on anticoagulation she presented with weakness with her history of fall. Concern for UTI secondary to dysuria complaints. Patient started on empiric ceftriaxone and urine cultures pending. PT consulted for evaluation.   Assessment & Plan:   Active Problems:   ARF (acute renal failure) (HCC)   UTI (urinary tract infection)   Acute kidney injury on CKD 3 Baseline creatinine of 1.3-1.5. Improved from 1.73 to 1.54 with IV fluids. Likely made worse secondary to patient's decreased oral intake which is secondary to dementia. -continue IV fluids -encourage PO intake  UTI Urine culture with no growth -discontinue Ceftriaxone  Essential hypertension Well controlled -continue amlodipine  Weakness -PT eval pending  Back pain Compression fracture T-12 -Continue Norco  Dementia Patient closer to baseline. Poor PO intake of nutrition -Continue Seroquel/Depakote -nutrition consult   DVT prophylaxis: Lovenox Code Status: DNR Family Communication: Care giver at bedside Disposition Plan: Discharge in 24-48 hours to SNF   Consultants:   None  Procedures:   None  Antimicrobials:  Ceftriaxone    Subjective: Afebrile  Objective: Vitals:   10/22/16 1355 10/22/16 2028 10/23/16 0505 10/23/16 1017  BP: (!) 106/48 (!) 106/49 (!) 113/54 (!) 118/58  Pulse: 68 76 73   Resp: 18 17 17    Temp: 98.9 F (37.2 C) 98.5 F (36.9 C) 98.9 F (37.2 C)   TempSrc: Oral Oral Oral   SpO2: 98% 99% 96%   Weight:   53.4 kg (117 lb 11.6 oz)   Height:        Intake/Output Summary (Last 24 hours) at 10/23/16 1324 Last data filed at 10/23/16 0651  Gross per 24 hour  Intake           1862.5 ml  Output              700 ml  Net           1162.5 ml   Filed Weights   10/21/16 2036 10/23/16 0505  Weight: 48.8 kg (107 lb 9.4 oz) 53.4 kg (117 lb 11.6 oz)    Examination:  General exam: Appears calm and comfortable Respiratory system: Clear to auscultation bilaterally. Unlabored work of breathing. No wheezing or rales. Cardiovascular system: S1 & S2 heard, RRR. Gastrointestinal system: Soft, non-tender, non-distended, no guarding, no rebound, no masses felt Central nervous system: Alert. Extremities: Mo calf tenderness Skin: No cyanosis. No rashes    Data Reviewed: I have personally reviewed following labs and imaging studies  CBC:  Recent Labs Lab 10/21/16 1200 10/22/16 0400  WBC 4.9 4.8  NEUTROABS 1.9  --   HGB 10.2* 9.5*  HCT 29.8* 26.8*  MCV 98.0 96.8  PLT 177 170   Basic Metabolic Panel:  Recent Labs Lab 10/21/16 1200 10/22/16 0400  NA 138 140  K 4.2 3.9  CL 103 110  CO2 27 24  GLUCOSE 87 76  BUN 16 14  CREATININE 1.73* 1.54*  CALCIUM 9.6 9.0   GFR: Estimated Creatinine Clearance: 14.9 mL/min (A) (by C-G formula based on SCr of 1.54 mg/dL (H)). Liver Function Tests: No results for input(s): AST, ALT, ALKPHOS, BILITOT, PROT, ALBUMIN in the last 168 hours. No results for input(s): LIPASE, AMYLASE in the last 168 hours. No results for input(s): AMMONIA in the last 168 hours. Coagulation Profile: No  results for input(s): INR, PROTIME in the last 168 hours. Cardiac Enzymes:  Recent Labs Lab 10/21/16 1200  CKTOTAL 31*   BNP (last 3 results) No results for input(s): PROBNP in the last 8760 hours. HbA1C: No results for input(s): HGBA1C in the last 72 hours. CBG: No results for input(s): GLUCAP in the last 168 hours. Lipid Profile: No results for input(s): CHOL, HDL, LDLCALC, TRIG, CHOLHDL, LDLDIRECT in the last 72 hours. Thyroid Function Tests: No results for input(s): TSH, T4TOTAL, FREET4, T3FREE, THYROIDAB in the last 72 hours. Anemia Panel: No results for input(s): VITAMINB12, FOLATE, FERRITIN, TIBC, IRON,  RETICCTPCT in the last 72 hours. Sepsis Labs: No results for input(s): PROCALCITON, LATICACIDVEN in the last 168 hours.  Recent Results (from the past 240 hour(s))  Urine culture     Status: None   Collection Time: 10/21/16 12:30 PM  Result Value Ref Range Status   Specimen Description URINE, CATHETERIZED  Final   Special Requests Normal  Final   Culture   Final    NO GROWTH Performed at [facility], [address], [place], [place] [address]    Report Status 10/22/2016 FINAL  Final         Radiology Studies: No results found.      Scheduled Meds: . acetaminophen  500 mg Oral BID  . amLODipine  2.5 mg Oral BID  . divalproex  250 mg Oral Q12H  . docusate sodium  100 mg Oral BID  . enoxaparin (LOVENOX) injection  30 mg Subcutaneous Q24H  . multivitamins with iron  1 tablet Oral Daily  . polyethylene glycol  17 g Oral Daily  . polyvinyl alcohol  1 drop Both Eyes TID  . QUEtiapine  100 mg Oral QHS   Continuous Infusions: . cefTRIAXone (ROCEPHIN)  IV Stopped (10/22/16 1237)     LOS: 1 day     Jacquelin Hawking, MD [facility] 10/23/2016, 1:24 PM Pager: [phone]  If 7PM-7AM, please contact night-coverage [email or web address] Password [email or web address] 10/23/2016, 1:24 PM

## 2016-10-24 DIAGNOSIS — E440 Moderate protein-calorie malnutrition: Secondary | ICD-10-CM

## 2016-10-24 DIAGNOSIS — E785 Hyperlipidemia, unspecified: Secondary | ICD-10-CM

## 2016-10-24 DIAGNOSIS — N183 Chronic kidney disease, stage 3 (moderate): Secondary | ICD-10-CM

## 2016-10-24 MED ORDER — DOCUSATE SODIUM 100 MG PO CAPS: 100.0000 mg | ORAL_CAPSULE | Freq: Two times a day (BID) | ORAL | 0 refills | Status: AC

## 2016-10-24 MED ORDER — ENSURE ENLIVE PO LIQD: 237.0000 mL | Freq: Two times a day (BID) | ORAL | Status: AC

## 2016-10-24 MED ORDER — ENSURE ENLIVE PO LIQD
237.0000 mL | Freq: Two times a day (BID) | ORAL | Status: DC
  Administered 2016-10-25: 237 mL via ORAL

## 2016-10-24 NOTE — Progress Notes (Addendum)
Initial Nutrition Assessment  DOCUMENTATION CODES:   Non-severe (moderate) malnutrition in context of chronic illness  INTERVENTION:   MVI daily   Ensure Enlive po BID, each supplement provides 350 kcal and 20 grams of protein  Magic cup TID with meals, each supplement provides 290 kcal and 9 grams of protein  NUTRITION DIAGNOSIS:   Malnutrition (Moderate) related to chronic illness (dementia) as evidenced by moderate depletions of muscle mass, moderate depletion of body fat.  GOAL:   Patient will meet greater than or equal to 90% of their needs  MONITOR:   PO intake, Supplement acceptance, Labs, Weight trends  REASON FOR ASSESSMENT:   Consult Assessment of nutrition requirement/status  ASSESSMENT:   Pt with PMH of A fib, anemia, HTN, renal disorder, and dementia. Presents this admission with acute renal failure and UTI.    Spoke with caregiver at bedside. Reports pt's appetite started decreasing after moving to SNF two years ago. She began eating three meals a day and gradually decreased to 1 meal per day for the last 8 months. States when pt doesn't eat her meal she drinks an Ensure. Pt loves eating chocolate ice cream, and has one small cup per day. Suspect pt's appetite has decreased but has been supplemented accordingly given her stable wt since October 2017.   Pt noted to weigh 110 lb 12/28/15 and 107 this admission, showing a wt loss of 2.7% in 10 months. This percentage in this time frame is not significant.   Nutrition-Focused physical exam completed. Findings are moderate fat depletion, moderate muscle depletion, and no edema. Pt shows to have moderate to severe depletions in all areas. Suspect most of them are due to advanced aging, and some from actual wt loss. Given her age, pt seems to be in decent nutrition standing. Will continue to monitor PO intake and supplement acceptance.   Medications reviewed and include: MVI with Iron, colace Labs reviewed: Creatinine  1.54 (H)   Diet Order:  Diet regular Room service appropriate? Yes; Fluid consistency: Thin  Skin:  Reviewed, no issues  Last BM:  10/24/16  Height:   Ht Readings from Last 1 Encounters:  10/21/16 4\' 11"  (1.499 m)    Weight:   Wt Readings from Last 1 Encounters:  10/23/16 117 lb 11.6 oz (53.4 kg)    Ideal Body Weight:  43.2 kg  BMI:  Body mass index is 23.78 kg/m.  Estimated Nutritional Needs:   Kcal:  1400-1600 (26-30 kcal/kg)  Protein:  70-80 grams (1.3-1.5 g/kg)  Fluid:  >1.4 L/day  EDUCATION NEEDS:   No education needs identified at this time  Vanessa Kick RD, LDN Clinical Nutrition Pager # - [phone]

## 2016-10-24 NOTE — [place] FL2 (Signed)
[place] MEDICAID FL2 LEVEL OF CARE SCREENING TOOL     IDENTIFICATION  Patient Name: Crystal Rowland Birthdate: 01-10-1915 Sex: female Admission Date (Current Location): 10/21/2016  [facility] and [place] Number:  Producer, television/film/video and Address:  [facility],  [address], [place] [address]      Provider Number: [id number]  Attending Physician Name and Address:  Narda Bonds, MD  Relative Name and Phone Number:       Current Level of Care: Hospital Recommended Level of Care: Skilled Nursing Facility Prior Approval Number:    Date Approved/Denied:   PASRR Number: [id number]  Discharge Plan: SNF    Current Diagnoses: Patient Active Problem List   Diagnosis Date Noted  . ARF (acute renal failure) (HCC) 10/21/2016  . UTI (urinary tract infection) 10/21/2016  . Hyperlipidemia 06/24/2011  . Chronic renal insufficiency 06/24/2011  . Aortic valve stenosis 06/24/2011  . Atrial fibrillation (HCC) 04/11/2011  . Anemia 04/11/2011    Orientation RESPIRATION BLADDER Height & Weight     Self, Situation, Place  Normal Incontinent, External catheter Weight: 117 lb 11.6 oz (53.4 kg) Height:  4\' 11"  (149.9 cm)  BEHAVIORAL SYMPTOMS/MOOD NEUROLOGICAL BOWEL NUTRITION STATUS      Incontinent Diet (regular diet thin fluid consistency)  AMBULATORY STATUS COMMUNICATION OF NEEDS Skin   Extensive Assist Verbally Normal                       Personal Care Assistance Level of Assistance  Bathing, Feeding, Dressing Bathing Assistance: Limited assistance Feeding assistance: Independent Dressing Assistance: Limited assistance     Functional Limitations Info  Sight, Hearing, Speech Sight Info: Adequate Hearing Info: Adequate Speech Info: Adequate    SPECIAL CARE FACTORS FREQUENCY  PT (By licensed PT), OT (By licensed OT)     PT Frequency: 5x OT Frequency: 5x            Contractures Contractures Info: Not present    Additional Factors Info   Code Status, Allergies Code Status Info: DNR Allergies Info: nka           Current Medications (10/24/2016):  This is the current hospital active medication list Current Facility-Administered Medications  Medication Dose Route Frequency Provider Last Rate Last Dose  . acetaminophen (TYLENOL) tablet 650 mg  650 mg Oral Q6H PRN Regalado, Belkys A, MD       Or  . acetaminophen (TYLENOL) suppository 650 mg  650 mg Rectal Q6H PRN Regalado, Belkys A, MD      . acetaminophen (TYLENOL) tablet 500 mg  500 mg Oral BID Regalado, Belkys A, MD   500 mg at 10/23/16 2203  . amLODipine (NORVASC) tablet 2.5 mg  2.5 mg Oral BID Regalado, Belkys A, MD   2.5 mg at 10/23/16 2203  . divalproex (DEPAKOTE SPRINKLE) capsule 250 mg  250 mg Oral Q12H Narda Bonds, MD   250 mg at 10/23/16 2204  . docusate sodium (COLACE) capsule 100 mg  100 mg Oral BID Regalado, Belkys A, MD   100 mg at 10/23/16 2204  . enoxaparin (LOVENOX) injection 30 mg  30 mg Subcutaneous Q24H Regalado, Belkys A, MD   30 mg at 10/23/16 2156  . HYDROcodone-acetaminophen (NORCO/VICODIN) 5-325 MG per tablet 1 tablet  1 tablet Oral Q6H PRN Regalado, Belkys A, MD      . multivitamins with iron tablet 1 tablet  1 tablet Oral Daily Regalado, Belkys A, MD   1 tablet at 10/23/16  1020  . polyethylene glycol (MIRALAX / GLYCOLAX) packet 17 g  17 g Oral Daily Regalado, Belkys A, MD   17 g at 10/23/16 1020  . polyvinyl alcohol (LIQUIFILM TEARS) 1.4 % ophthalmic solution 1 drop  1 drop Both Eyes TID Regalado, Belkys A, MD   1 drop at 10/23/16 2213  . QUEtiapine (SEROQUEL) tablet 100 mg  100 mg Oral QHS Regalado, Belkys A, MD   100 mg at 10/23/16 2203     Discharge Medications: Please see discharge summary for a list of discharge medications.  Relevant Imaging Results:  Relevant Lab Results:   Additional Information SS#[id number]  Nelwyn Salisbury, LCSW

## 2016-10-24 NOTE — Progress Notes (Signed)
Provider paged regarding update related to patient placement. Pt awaiting bed.

## 2016-10-24 NOTE — Progress Notes (Signed)
CSW received call from [facility] stating pt's insurance plan not accepted at facility.  CSW contact [facility]- no beds today. No other bed offers currently. Expanded SNF bed search.  Updated pt/family.   Ilean Skill, MSW, LCSW Clinical Social Work 10/24/2016 [phone]

## 2016-10-24 NOTE — Clinical Social Work Placement (Signed)
Pt discharging today to transfer to [facility] SNF for ST rehab. Report # for RN- [phone] Long term plan to return to her [facility] after rehab. Pt will transport via personal vehicle- friend/POA Lanora Manis. All information provided to facility via the HUB. See below for placement details  CLINICAL SOCIAL WORK PLACEMENT  NOTE  Date:  10/24/2016  Patient Details  Name: Crystal Rowland MRN: [id number] Date of Birth: Dec 08, 1914  Clinical Social Work is seeking post-discharge placement for this patient at the Skilled  Nursing Facility level of care (*CSW will initial, date and re-position this form in  chart as items are completed):  Yes   Patient/family provided with [place] Clinical Social Work Department's list of facilities offering this level of care within the geographic area requested by the patient (or if unable, by the patient's family).  Yes   Patient/family informed of their freedom to choose among providers that offer the needed level of care, that participate in Medicare, Medicaid or managed care program needed by the patient, have an available bed and are willing to accept the patient.  Yes   Patient/family informed of [facility]'s ownership interest in [facility] and [facility], as well as of the fact that they are under no obligation to receive care at these facilities.  PASRR submitted to EDS on 10/24/16     PASRR number received on 10/24/16     Existing PASRR number confirmed on       FL2 transmitted to all facilities in geographic area requested by pt/family on 10/24/16     FL2 transmitted to all facilities within larger geographic area on       Patient informed that his/her managed care company has contracts with or will negotiate with certain facilities, including the following:        Yes   Patient/family informed of bed offers received.  Patient chooses bed at [facility]     Physician recommends and patient chooses bed at  [facility]    Patient to be transferred to [facility] on 10/24/16.  Patient to be transferred to facility by family     Patient family notified on 10/24/16 of transfer.  Name of family member notified:  POA/Friend Lanora Manis     PHYSICIAN       Additional Comment:    _______________________________________________ Nelwyn Salisbury, LCSW 10/24/2016, 3:16 PM

## 2016-10-24 NOTE — Discharge Instructions (Signed)
Acute Kidney Injury, Adult Acute kidney injury is a sudden worsening of kidney function. The kidneys are organs that have several jobs. They filter the blood to remove waste products and extra fluid. They also maintain a healthy balance of minerals and hormones in the body, which helps control blood pressure and keep bones strong. With this condition, your kidneys do not do their jobs as well as they should. This condition ranges from mild to severe. Over time it may develop into long-lasting (chronic) kidney disease. Early detection and treatment may prevent acute kidney injury from developing into a chronic condition. What are the causes? Common causes of this condition include:  A problem with blood flow to the kidneys. This may be caused by:  Low blood pressure (hypotension) or shock.  Blood loss.  Heart and blood vessel (cardiovascular) disease.  Severe burns.  Liver disease.  Direct damage to the kidneys. This may be caused by:  Certain medicines.  A kidney infection.  Poisoning.  Being around or in contact with toxic substances.  A surgical wound.  A hard, direct hit to the kidney area.  A sudden blockage of urine flow. This may be caused by:  Cancer.  Kidney stones.  An enlarged prostate in males. What are the signs or symptoms? Symptoms of this condition may not be obvious until the condition becomes severe. Symptoms of this condition can include:  Tiredness (lethargy), or difficulty staying awake.  Nausea or vomiting.  Swelling (edema) of the face, legs, ankles, or feet.  Problems with urination, such as:  Abdominal pain, or pain along the side of your stomach (flank).  Decreased urine production.  Decrease in the force of urine flow.  Muscle twitches and cramps, especially in the legs.  Confusion or trouble concentrating.  Loss of appetite.  Fever. How is this diagnosed? This condition may be diagnosed with tests, including:  Blood  tests.  Urine tests.  Imaging tests.  A test in which a sample of tissue is removed from the kidneys to be examined under a microscope (kidney biopsy). How is this treated? Treatment for this condition depends on the cause and how severe the condition is. In mild cases, treatment may not be needed. The kidneys may heal on their own. In more severe cases, treatment will involve:  Treating the cause of the kidney injury. This may involve changing any medicines you are taking or adjusting your dosage.  Fluids. You may need specialized IV fluids to balance your body's needs.  Having a catheter placed to drain urine and prevent blockages.  Preventing problems from occurring. This may mean avoiding certain medicines or procedures that can cause further injury to the kidneys. In some cases treatment may also require:  A procedure to remove toxic wastes from the body (dialysis or continuous renal replacement therapy - CRRT).  Surgery. This may be done to repair a torn kidney, or to remove the blockage from the urinary system. Follow these instructions at home: Medicines   Take over-the-counter and prescription medicines only as told by your health care provider.  Do not take any new medicines without your health care provider's approval. Many medicines can worsen your kidney damage.  Do not take any vitamin and mineral supplements without your health care provider's approval. Many nutritional supplements can worsen your kidney damage. Lifestyle   If your health care provider prescribed changes to your diet, follow them. You may need to decrease the amount of protein you eat.  Achieve and maintain a   healthy weight. If you need help with this, ask your health care provider.  Start or continue an exercise plan. Try to exercise at least 30 minutes a day, 5 days a week.  Do not use any tobacco products, such as cigarettes, chewing tobacco, and e-cigarettes. If you need help quitting, ask  your health care provider. General instructions   Keep track of your blood pressure. Report changes in your blood pressure as told by your health care provider.  Stay up to date with immunizations. Ask your health care provider which immunizations you need.  Keep all follow-up visits as told by your health care provider. This is important. Where to find more information:  American Association of Kidney Patients: www.aakp.org  National Kidney Foundation: www.kidney.org  American Kidney Fund: www.akfinc.org  Life Options Rehabilitation Program:  www.lifeoptions.org  www.kidneyschool.org Contact a health care provider if:  Your symptoms get worse.  You develop new symptoms. Get help right away if:  You develop symptoms of worsening kidney disease, which include:  Headaches.  Abnormally dark or light skin.  Easy bruising.  Frequent hiccups.  Chest pain.  Shortness of breath.  End of menstruation in women.  Seizures.  Confusion or altered mental status.  Abdominal or back pain.  Itchiness.  You have a fever.  Your body is producing less urine.  You have pain or bleeding when you urinate. Summary  Acute kidney injury is a sudden worsening of kidney function.  Acute kidney injury can be caused by problems with blood flow to the kidneys, direct damage to the kidneys, and sudden blockage of urine flow.  Symptoms of this condition may not be obvious until it becomes severe. Symptoms may include edema, lethargy, confusion, nausea or vomiting, and problems passing urine.  This condition can usually be diagnosed with blood tests, urine tests, and imaging tests. Sometimes a kidney biopsy is done to diagnose this condition.  Treatment for this condition often involves treating the underlying cause. It is treated with fluids, medicines, dialysis, diet changes, or surgery. This information is not intended to replace advice given to you by your health care provider.  Make sure you discuss any questions you have with your health care provider. Document Released: 09/02/2010 Document Revised: 02/08/2016 Document Reviewed: 02/08/2016 Elsevier Interactive Patient Education  2017 Elsevier Inc.  

## 2016-10-24 NOTE — Plan of Care (Signed)
Problem: Safety: Goal: Ability to remain free from injury will improve Outcome: Completed/Met Date Met: 10/24/16 Pt is out of bed 1-2 assist with use of walker. Sitter at bedside

## 2016-10-25 DIAGNOSIS — I358 Other nonrheumatic aortic valve disorders: Secondary | ICD-10-CM | POA: Diagnosis not present

## 2016-10-25 DIAGNOSIS — N189 Chronic kidney disease, unspecified: Secondary | ICD-10-CM | POA: Diagnosis not present

## 2016-10-25 DIAGNOSIS — Z5189 Encounter for other specified aftercare: Secondary | ICD-10-CM | POA: Diagnosis not present

## 2016-10-25 DIAGNOSIS — R278 Other lack of coordination: Secondary | ICD-10-CM | POA: Diagnosis not present

## 2016-10-25 DIAGNOSIS — M6281 Muscle weakness (generalized): Secondary | ICD-10-CM | POA: Diagnosis not present

## 2016-10-25 DIAGNOSIS — R531 Weakness: Secondary | ICD-10-CM | POA: Diagnosis not present

## 2016-10-25 DIAGNOSIS — N179 Acute kidney failure, unspecified: Secondary | ICD-10-CM | POA: Diagnosis not present

## 2016-10-25 DIAGNOSIS — S22080S Wedge compression fracture of T11-T12 vertebra, sequela: Secondary | ICD-10-CM | POA: Diagnosis not present

## 2016-10-25 DIAGNOSIS — E782 Mixed hyperlipidemia: Secondary | ICD-10-CM | POA: Diagnosis not present

## 2016-10-25 DIAGNOSIS — D649 Anemia, unspecified: Secondary | ICD-10-CM | POA: Diagnosis not present

## 2016-10-25 DIAGNOSIS — I4891 Unspecified atrial fibrillation: Secondary | ICD-10-CM | POA: Diagnosis not present

## 2016-10-25 DIAGNOSIS — R2681 Unsteadiness on feet: Secondary | ICD-10-CM | POA: Diagnosis not present

## 2016-10-25 NOTE — Progress Notes (Signed)
Patient discharged on 10/24/2016. Unable to leave secondary to bed availability. Reviewed patient's chart and still ready for discharge. Discharge summary updated to include today's date as discharge date.  Jacquelin Hawking, MD [facility] 10/25/2016, 10:03 AM Pager: [phone]

## 2016-10-25 NOTE — Clinical Social Work Placement (Addendum)
   CLINICAL SOCIAL WORK PLACEMENT  NOTE 10/25/16 - DISCHARGED TO [facility], TRANSPORTED BY FRIEND  Date:  10/25/2016  Patient Details  Name: Crystal Rowland MRN: [id number] Date of Birth: 03/11/1914  Clinical Social Work is seeking post-discharge placement for this patient at the Skilled  Nursing Facility level of care (*CSW will initial, date and re-position this form in  chart as items are completed):  Yes   Patient/family provided with [facility] Clinical Social Work Department's list of facilities offering this level of care within the geographic area requested by the patient (or if unable, by the patient's family).  Yes   Patient/family informed of their freedom to choose among providers that offer the needed level of care, that participate in Medicare, Medicaid or managed care program needed by the patient, have an available bed and are willing to accept the patient.  Yes   Patient/family informed of [facility]'s ownership interest in [facility] and [facility], as well as of the fact that they are under no obligation to receive care at these facilities.  PASRR submitted to EDS on 10/24/16     PASRR number received on 10/24/16     Existing PASRR number confirmed on       FL2 transmitted to all facilities in geographic area requested by pt/family on 10/24/16     FL2 transmitted to all facilities within larger geographic area on       Patient informed that his/her managed care company has contracts with or will negotiate with certain facilities, including the following:        Yes   Patient/family informed of bed offers received.  Patient [facility] bed at [facility]      Physician recommends and patient chooses bed at [facility]    Patient to be transferred to [facility] on 10/24/16.  Patient to be transferred to facility by family     Patient family notified on 10/24/16 of transfer.  Name of family member notified:  POA/Friend Lanora Manis      PHYSICIAN       Additional Comment:    _______________________________________________ Cristobal Goldmann, LCSW 10/25/2016, 5:38 PM

## 2016-10-25 NOTE — Care Management Note (Signed)
Case Management Note  Patient Details  Name: Crystal Rowland MRN: [id number] Date of Birth: 04-Feb-1915  Subjective/Objective:                 Patient with order to discharge to Skilled Nursing Facility (SNF). SNF discharge facilitated through Clinical Social Work (CSW). Please refer to CSW notes for disposition plan and direct questions accordingly. Lawerance Sabal RN [employer] [phone].    Action/Plan:   Expected Discharge Date:  10/24/16               Expected Discharge Plan:  Skilled Nursing Facility  In-House Referral:  Clinical Social Work  Discharge planning Services  CM Consult  Post Acute Care Choice:    Choice offered to:     DME Arranged:    DME Agency:     HH Arranged:    HH Agency:     Status of Service:  Completed, signed off  If discussed at [employer], dates discussed:    Additional Comments:  Lawerance Sabal, RN 10/25/2016, 8:42 AM

## 2016-10-27 DIAGNOSIS — N179 Acute kidney failure, unspecified: Secondary | ICD-10-CM | POA: Diagnosis not present

## 2016-10-27 DIAGNOSIS — N189 Chronic kidney disease, unspecified: Secondary | ICD-10-CM | POA: Diagnosis not present

## 2016-10-27 DIAGNOSIS — R531 Weakness: Secondary | ICD-10-CM | POA: Diagnosis not present

## 2016-10-30 DIAGNOSIS — I358 Other nonrheumatic aortic valve disorders: Secondary | ICD-10-CM | POA: Diagnosis not present

## 2016-10-30 DIAGNOSIS — D649 Anemia, unspecified: Secondary | ICD-10-CM | POA: Diagnosis not present

## 2016-10-30 DIAGNOSIS — E782 Mixed hyperlipidemia: Secondary | ICD-10-CM | POA: Diagnosis not present

## 2016-10-30 DIAGNOSIS — I4891 Unspecified atrial fibrillation: Secondary | ICD-10-CM | POA: Diagnosis not present

## 2016-12-01 DEATH — deceased

## 2017-10-25 IMAGING — DX DG CHEST 2V
2 series · 2 of 2 positions shown · non-contrast
Comparison: 03/21/2016

CLINICAL DATA: Fall

EXAM:
CHEST  2 VIEW

[chest lat]
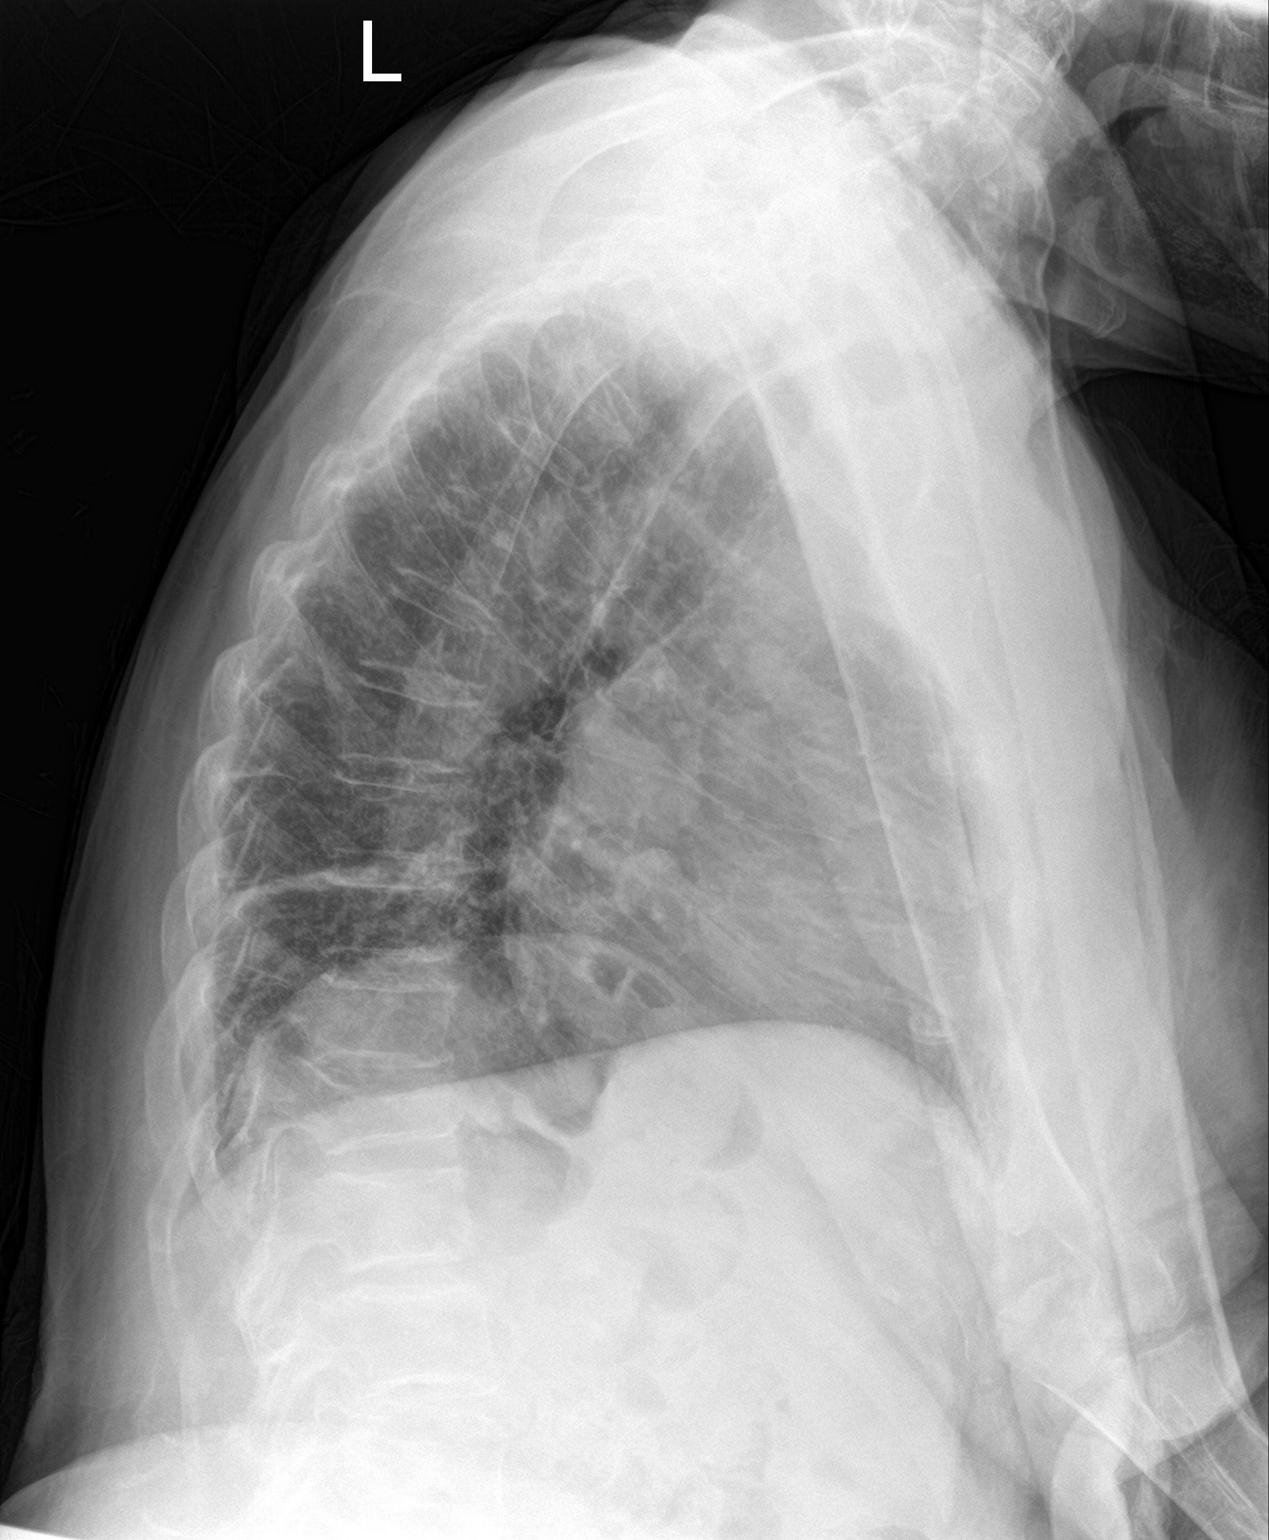

[chest ap]
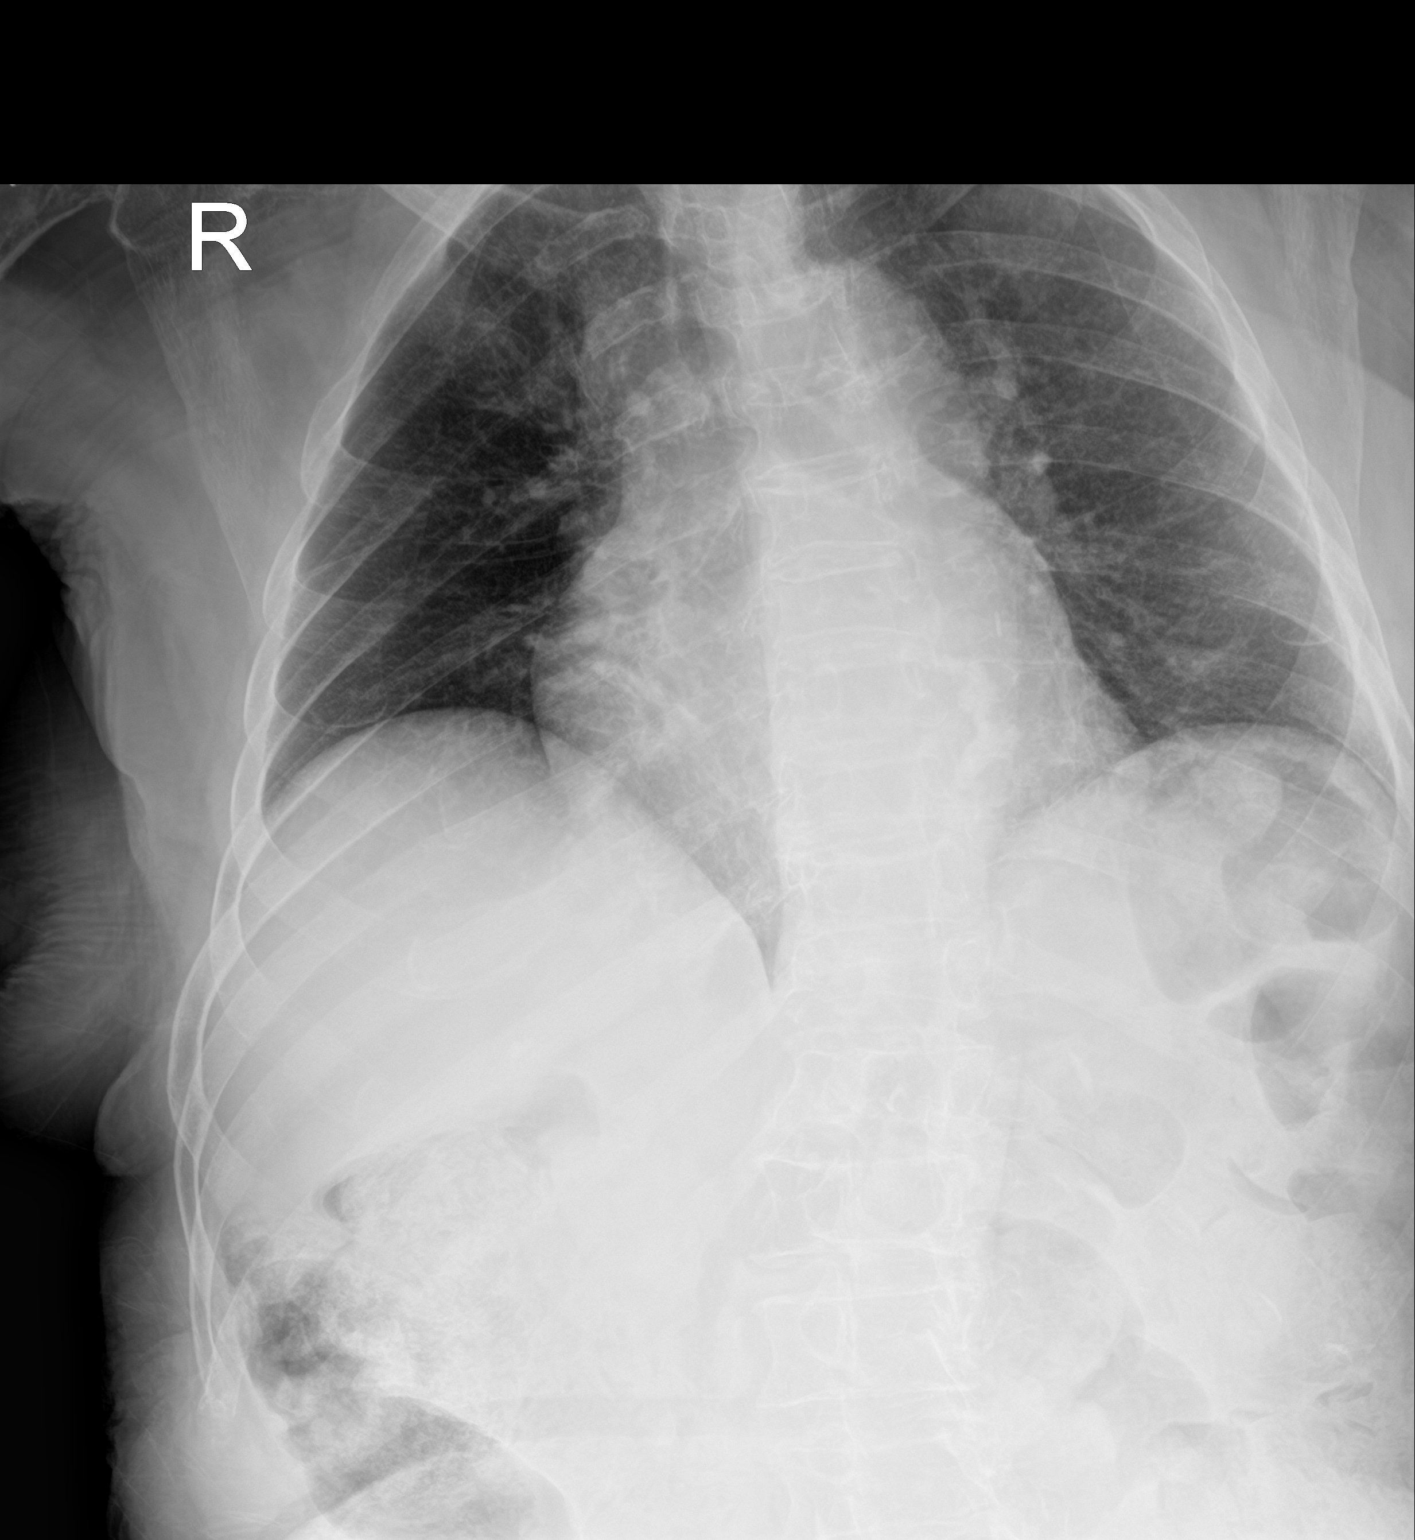

[2 of 2 positions shown; findings below may reference images not displayed]

FINDINGS: Lungs are clear.  No pleural effusion or pneumothorax.

The heart is normal in size.

Stable mild superior endplate compression fracture deformity
involving a lower thoracic vertebral body.
IMPRESSION: No evidence of acute cardiopulmonary disease.

## 2017-10-25 IMAGING — DX DG LUMBAR SPINE COMPLETE 4+V
5 series · 5 of 5 positions shown · non-contrast
Comparison: 12/28/2015

CLINICAL DATA: Fall with low back pain.

EXAM:
LUMBAR SPINE - COMPLETE 4+ VIEW

[l-spine ap]
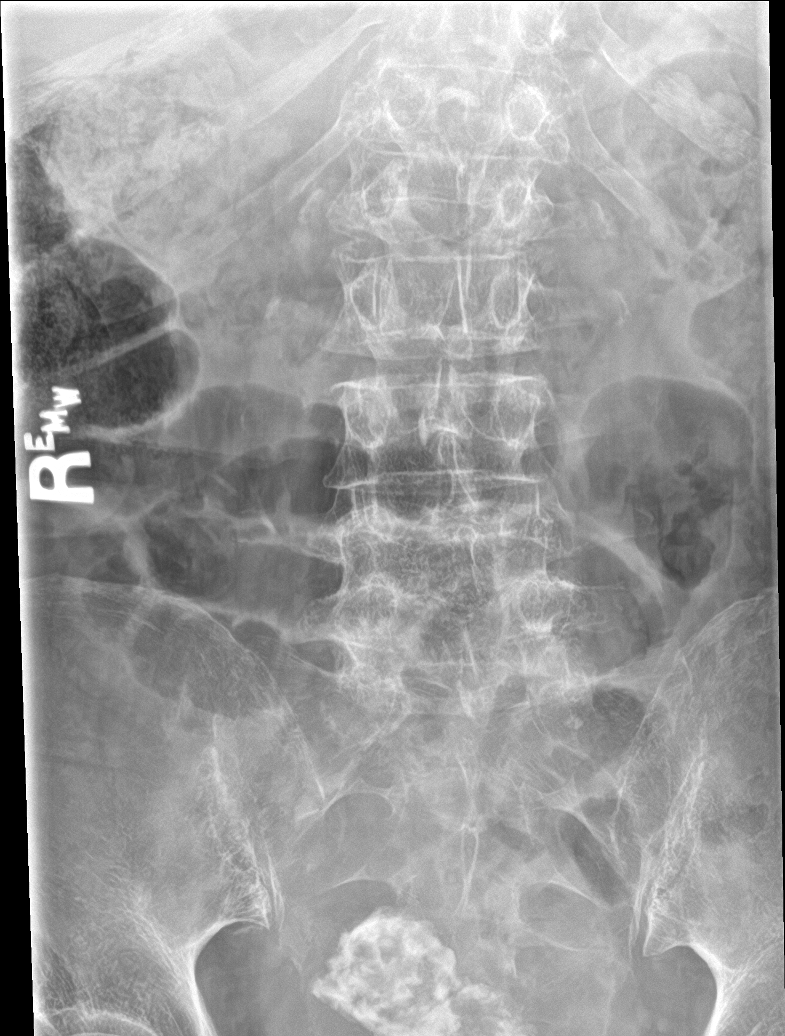

[l-spine obl (1 of 2)]
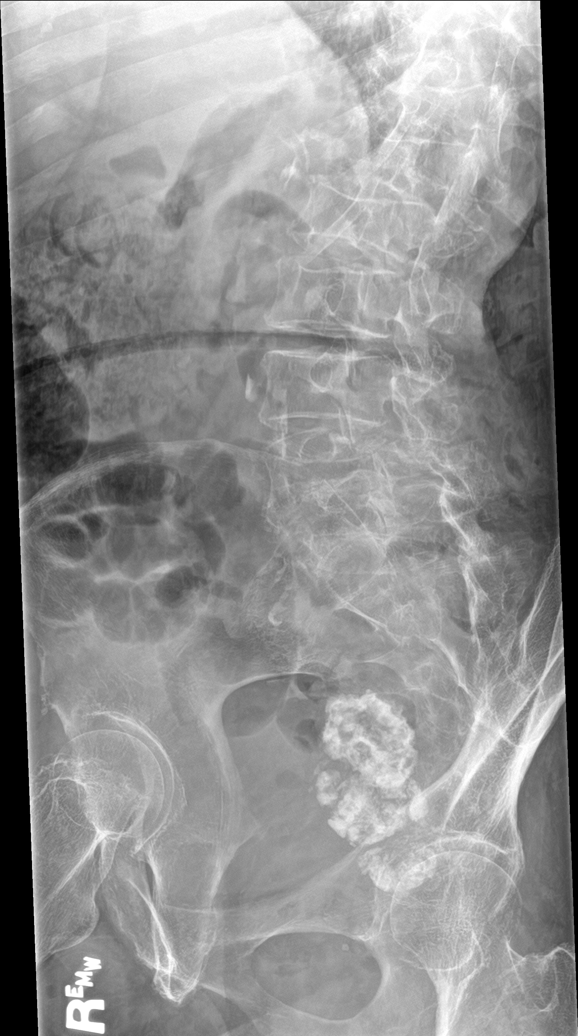

[l-spine obl (2 of 2)]
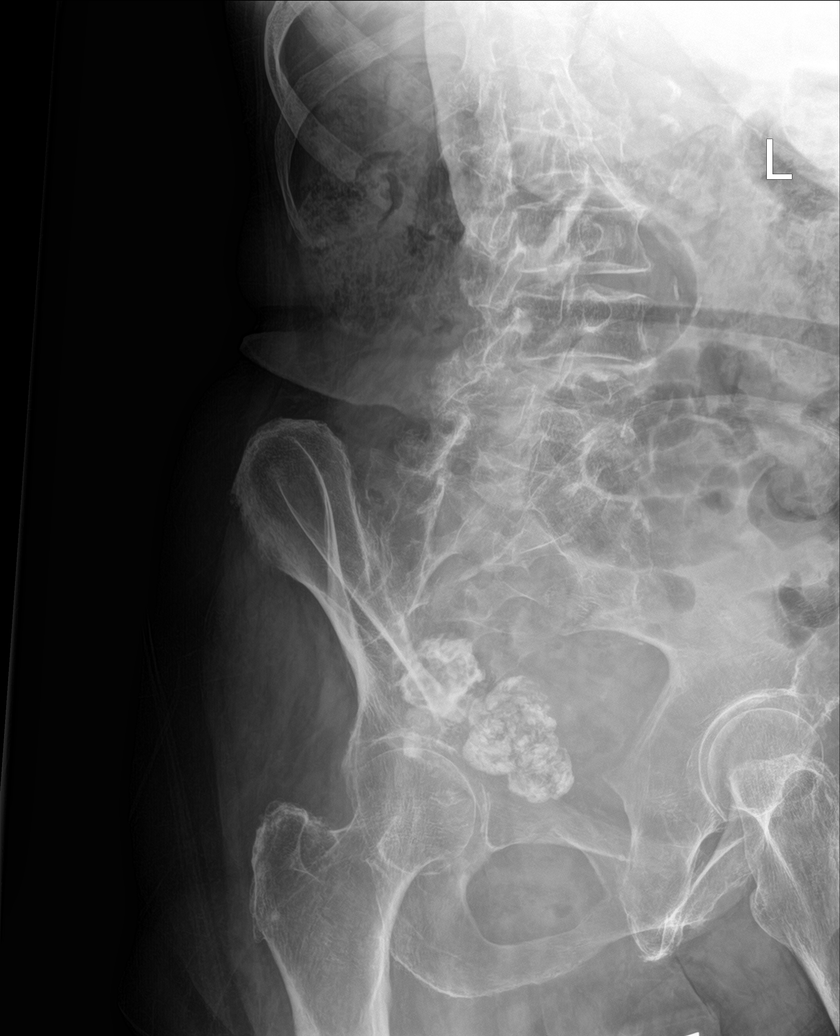

[l-spine lat]
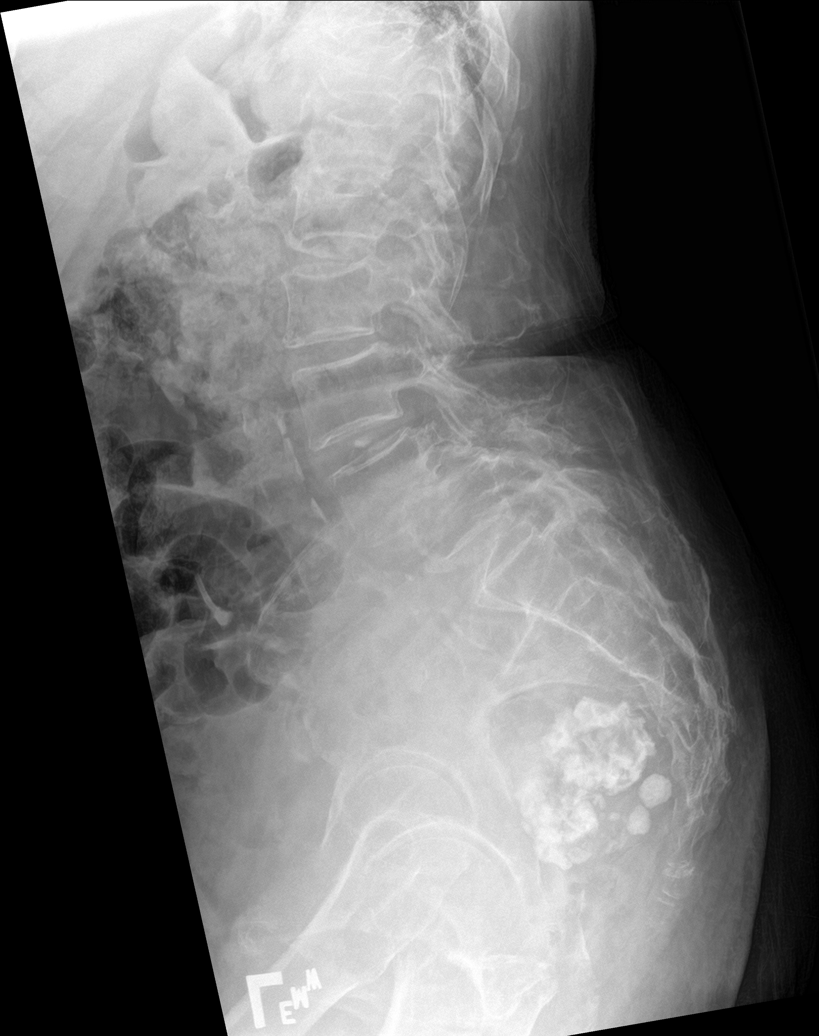

[l-spine spot]
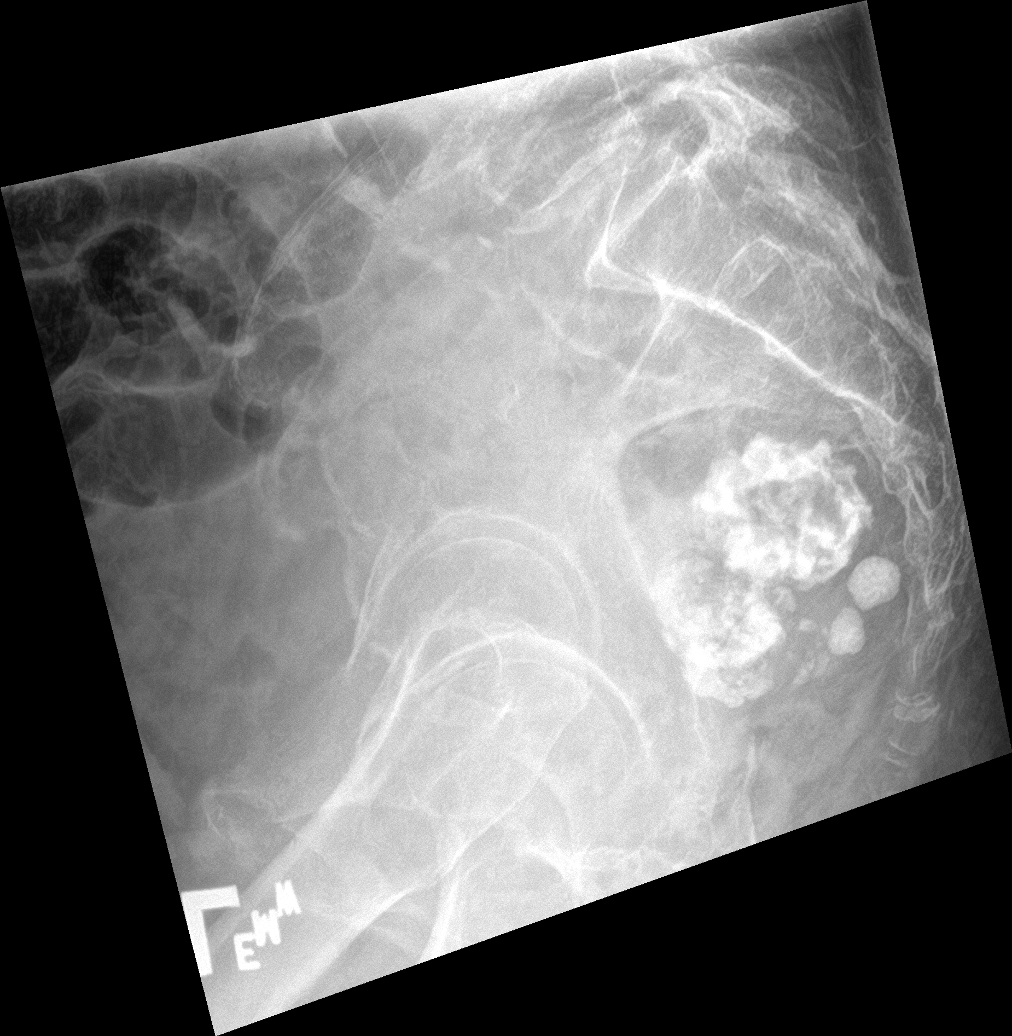

[5 of 5 positions shown; findings below may reference images not displayed]

FINDINGS: Bones are diffusely demineralized. Demineralization hinders
assessment. Lateral film again shows compression deformity at the
level of T11 and mild anterior wedge deformity of L5, stable in the
interval. Grade 2 anterolisthesis of L4 on 5 is unchanged. SI joints
are unremarkable. Dystrophic calcification over the central pelvis
compatible with uterine fibroids.
IMPRESSION: Stable exam. Compression deformity seen at T11 and L5 with grade 2
anterolisthesis of L4 on 5. No definite acute fracture on today's
study.
